# Patient Record
Sex: Female | Born: 1960 | Race: Black or African American | Hispanic: No | Marital: Married | Smoking: Former smoker
Health system: Southern US, Community
[De-identification: ages and names within clinical notes are randomized; demographics above are authoritative.]

## PROBLEM LIST (undated history)

## (undated) DIAGNOSIS — F419 Anxiety disorder, unspecified: Secondary | ICD-10-CM

## (undated) DIAGNOSIS — D709 Neutropenia, unspecified: Secondary | ICD-10-CM

## (undated) DIAGNOSIS — F32A Depression, unspecified: Secondary | ICD-10-CM

## (undated) DIAGNOSIS — D649 Anemia, unspecified: Secondary | ICD-10-CM

## (undated) DIAGNOSIS — A90 Dengue fever [classical dengue]: Secondary | ICD-10-CM

## (undated) DIAGNOSIS — C801 Malignant (primary) neoplasm, unspecified: Secondary | ICD-10-CM

## (undated) DIAGNOSIS — I10 Essential (primary) hypertension: Secondary | ICD-10-CM

## (undated) DIAGNOSIS — F329 Major depressive disorder, single episode, unspecified: Secondary | ICD-10-CM

## (undated) DIAGNOSIS — E079 Disorder of thyroid, unspecified: Secondary | ICD-10-CM

## (undated) HISTORY — DX: Disorder of thyroid, unspecified: E07.9

## (undated) HISTORY — DX: Essential (primary) hypertension: I10

## (undated) HISTORY — DX: Malignant (primary) neoplasm, unspecified: C80.1

## (undated) HISTORY — DX: Depression, unspecified: F32.A

## (undated) HISTORY — DX: Anemia, unspecified: D64.9

## (undated) HISTORY — DX: Dengue fever (classical dengue): A90

## (undated) HISTORY — DX: Neutropenia, unspecified: D70.9

## (undated) HISTORY — DX: Anxiety disorder, unspecified: F41.9

---
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
## 2006-04-18 ENCOUNTER — Ambulatory Visit (HOSPITAL_COMMUNITY): Admission: RE | Admit: 2006-04-18 | Discharge: 2006-04-18 | Payer: Self-pay | Admitting: Family Medicine

## 2006-04-26 ENCOUNTER — Encounter: Admission: RE | Admit: 2006-04-26 | Discharge: 2006-04-26 | Payer: Self-pay | Admitting: Family Medicine

## 2006-08-21 ENCOUNTER — Other Ambulatory Visit: Admission: RE | Admit: 2006-08-21 | Discharge: 2006-08-21 | Payer: Self-pay | Admitting: Family Medicine

## 2006-09-24 ENCOUNTER — Encounter: Admission: RE | Admit: 2006-09-24 | Discharge: 2006-09-24 | Payer: Self-pay | Admitting: Family Medicine

## 2007-07-17 ENCOUNTER — Encounter: Admission: RE | Admit: 2007-07-17 | Discharge: 2007-07-17 | Payer: Self-pay | Admitting: Family Medicine

## 2008-01-27 ENCOUNTER — Other Ambulatory Visit: Admission: RE | Admit: 2008-01-27 | Discharge: 2008-01-27 | Payer: Self-pay | Admitting: Family Medicine

## 2008-08-04 ENCOUNTER — Ambulatory Visit (HOSPITAL_COMMUNITY): Admission: RE | Admit: 2008-08-04 | Discharge: 2008-08-04 | Payer: Self-pay | Admitting: Family Medicine

## 2008-08-07 ENCOUNTER — Encounter: Admission: RE | Admit: 2008-08-07 | Discharge: 2008-08-07 | Payer: Self-pay | Admitting: Family Medicine

## 2009-04-04 IMAGING — MG MM DIAGNOSTIC UNILATERAL L
2 series · 2 of 2 positions shown · non-contrast
Comparison: Previous examinations.

DG DIAGNOSTIC UNILATERAL L
CC and MLO view(s) were taken of the left breast.

LEFT BREAST ULTRASOUND
DIGITAL UNILATERAL LEFT DIAGNOSTIC MAMMOGRAM WITH CAD AND LEFT BREAST ULTRASOUND:
CLINICAL DATA: Pain and tenderness in the lateral aspects of the left breast.

[L CC]
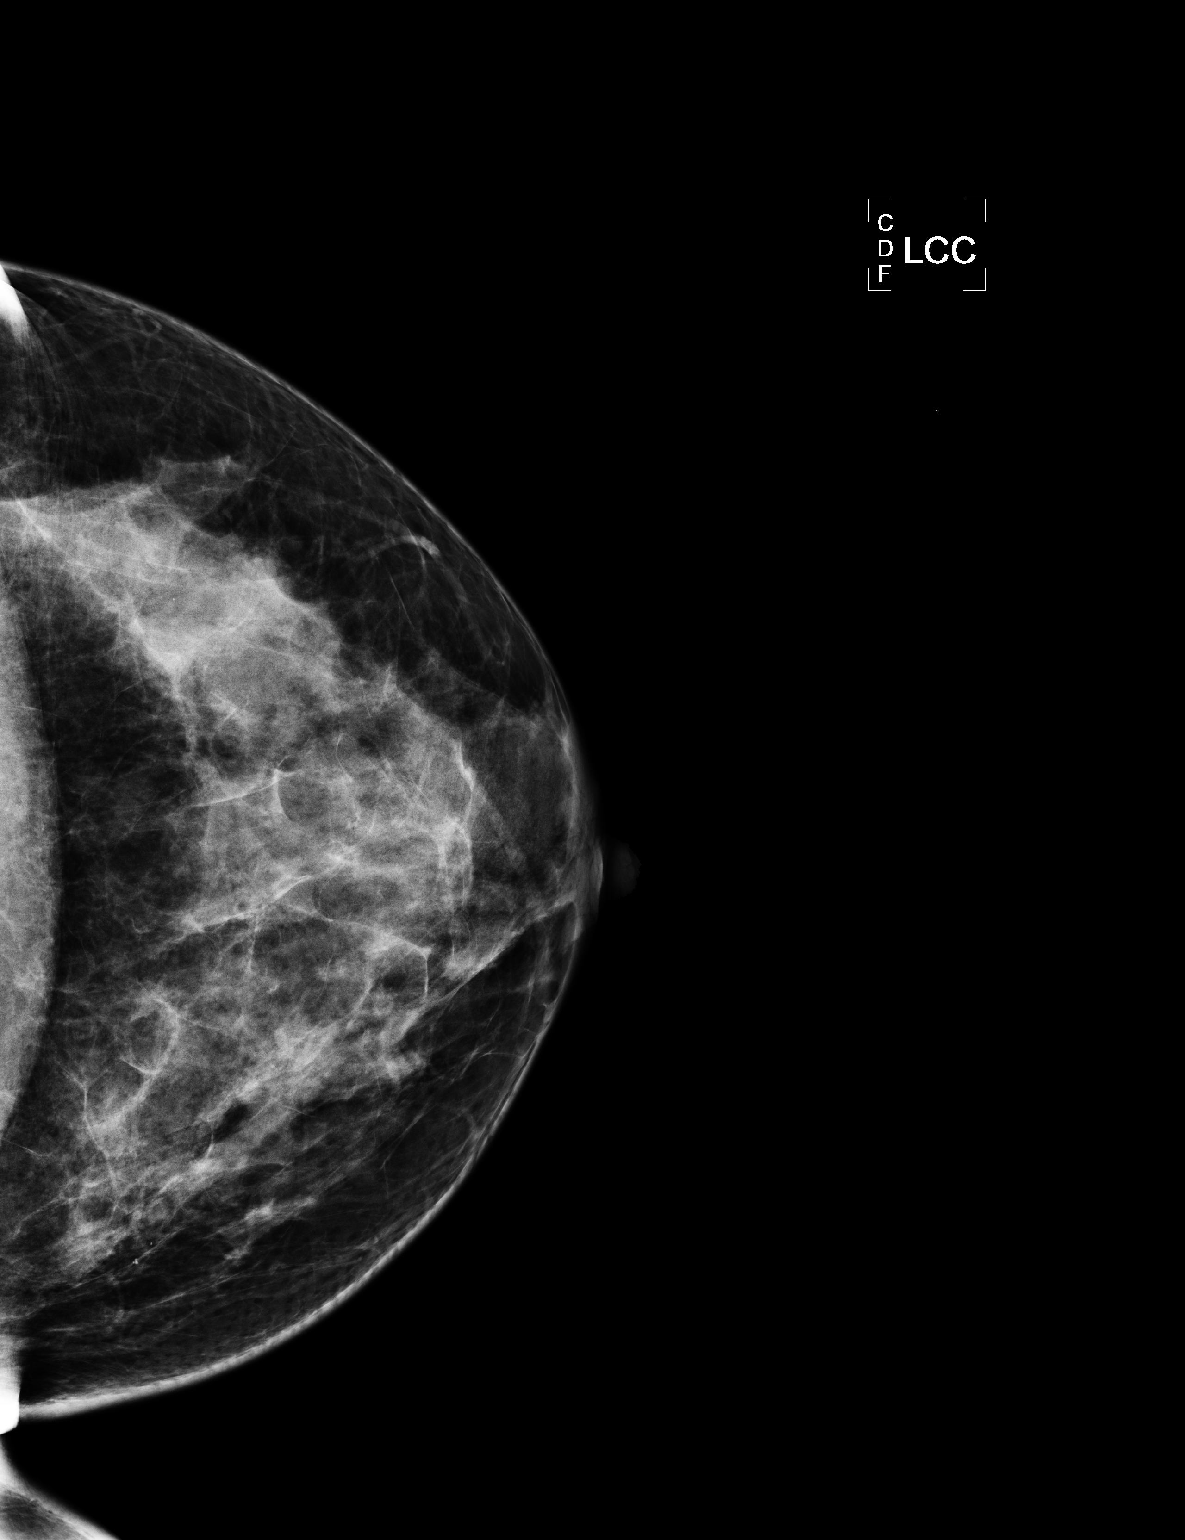

[L MLO]
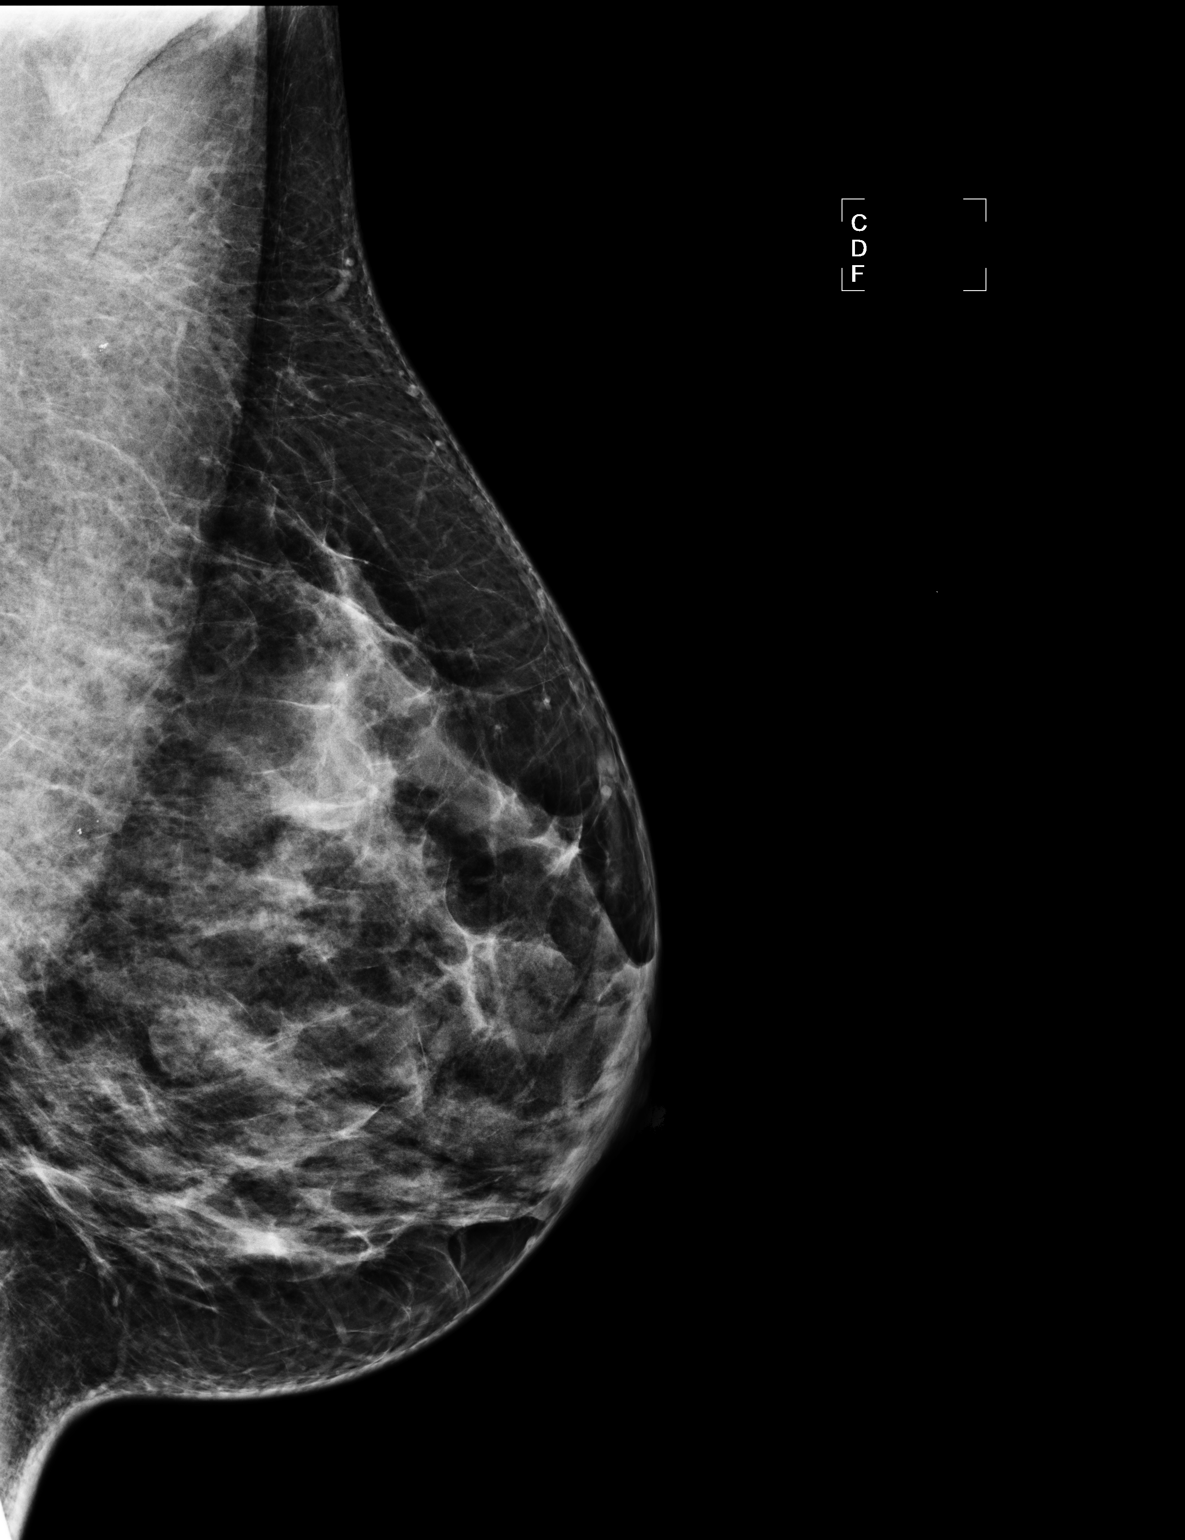

[2 of 2 positions shown; findings below may reference images not displayed]

Heterogeneously dense parenchyma in the left breast is stable with no findings suspicious for 
malignancy.

Physical examination of the lateral aspects of the left breast demonstrate no palpable masses.  
Sonographic examination of those portions of the breast again demonstrated a cluster of microcysts.
This is currently in the [DATE] position, approximately 5 cm from the nipple.   On 04/26/06, this 
measured 1.2 x 1.2 x 0.5 cm in maximum dimensions.  Currently, this measures 1.2 x 0.8 x 0.6 cm in 
maximum corresponding dimensions.  There are additional small, simple cysts scattered in the 
lateral aspects of the left breast, as well as some fibrocystic changes.  No solid masses or other 
findings suspicious for malignancy were seen.
IMPRESSION: Stable left breast cluster of microcysts.  There are additional simple cysts in the left breast 
with no evidence of malignancy.  Annual screening mammography is recommended.  The patient will be 
due for her next screening mammogram on 04/19/07.

ASSESSMENT: Benign - BI-RADS 2

Screening mammogram of both breasts in 7 months.
ANALYZED BY COMPUTER AIDED DETECTION. ,

## 2009-10-28 ENCOUNTER — Ambulatory Visit (HOSPITAL_COMMUNITY): Admission: RE | Admit: 2009-10-28 | Discharge: 2009-10-28 | Payer: Self-pay | Admitting: Obstetrics and Gynecology

## 2010-07-03 LAB — CBC
MCH: 29.1 pg (ref 26.0–34.0)
MCHC: 33.9 g/dL (ref 30.0–36.0)
MCV: 85.9 fL (ref 78.0–100.0)
Platelets: 220 10*3/uL (ref 150–400)
RDW: 16.2 % — ABNORMAL HIGH (ref 11.5–15.5)

## 2010-07-03 LAB — HCG, SERUM, QUALITATIVE: Preg, Serum: NEGATIVE

## 2010-08-05 ENCOUNTER — Other Ambulatory Visit: Payer: Self-pay | Admitting: Obstetrics and Gynecology

## 2011-02-16 IMAGING — MG MM DIAGNOSTIC LTD LEFT
2 series · 2 of 2 positions shown · non-contrast
Comparison: 08/04/2008

CLINICAL DATA: The patient returns after screening study for
evaluation of the left breast.

DIGITAL DIAGNOSTIC  LEFT  MAMMOGRAM  WITH CAD AND LEFT BREAST
ULTRASOUND:

[L ML]
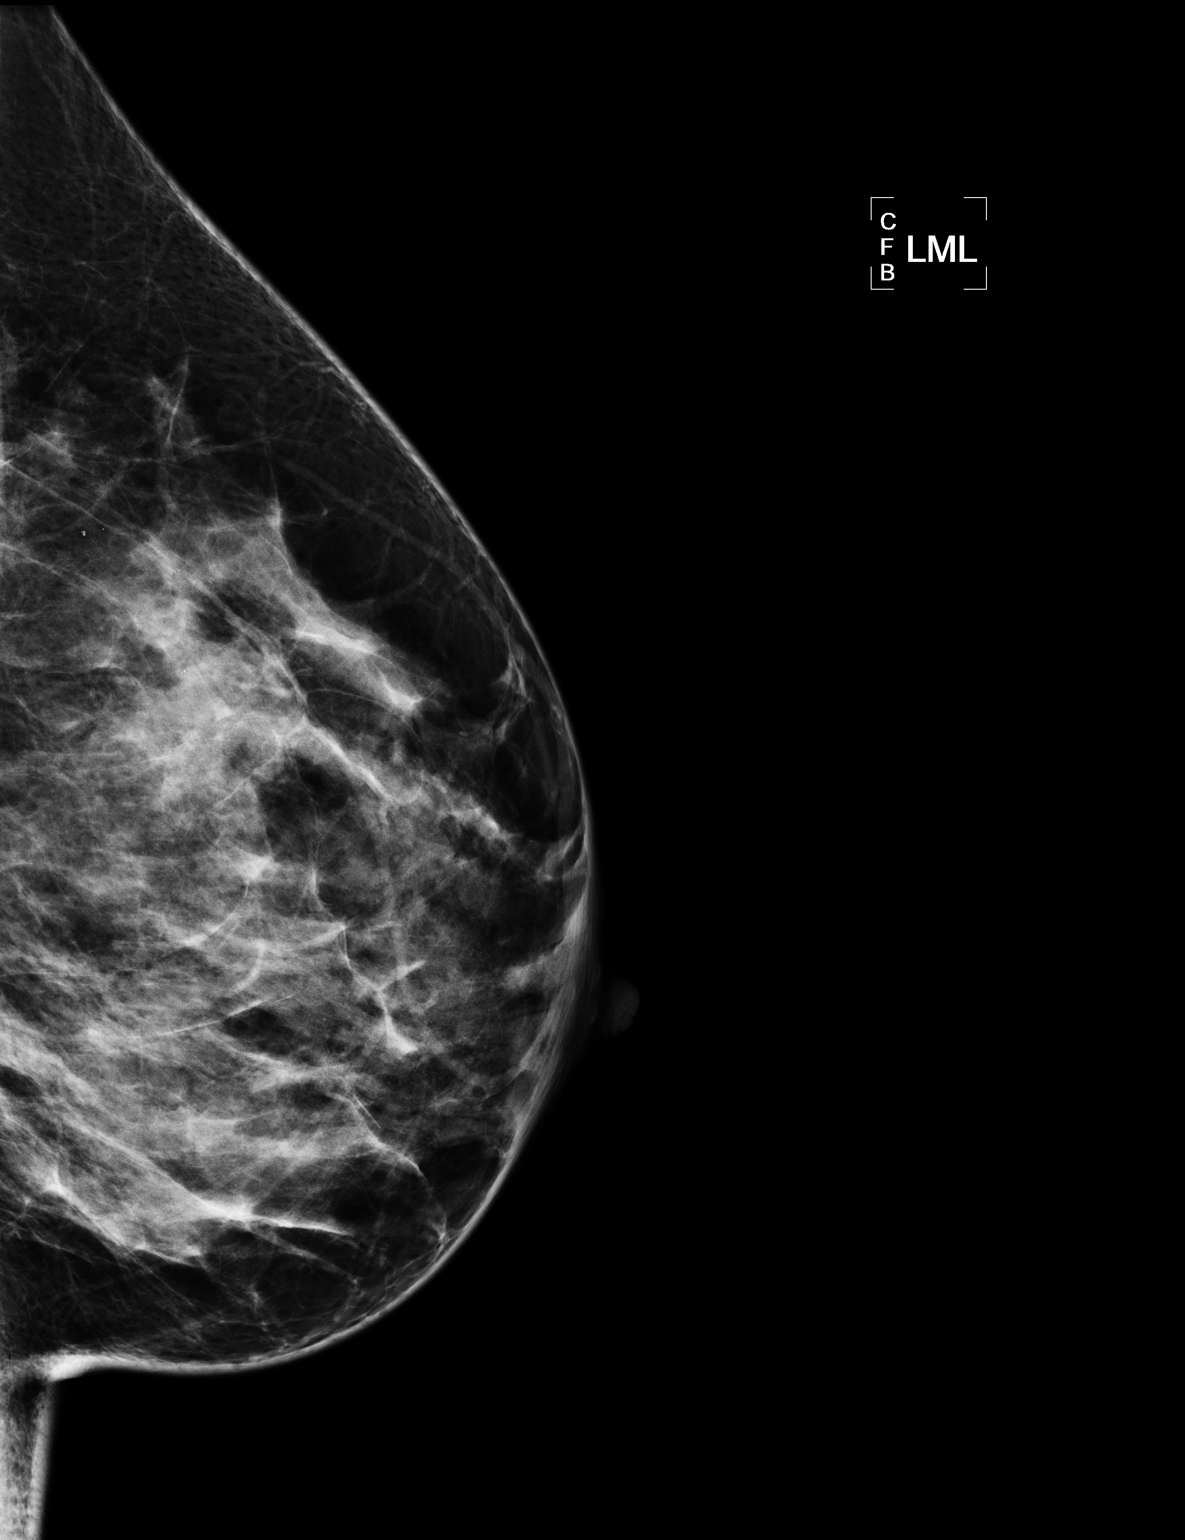

[L CC]
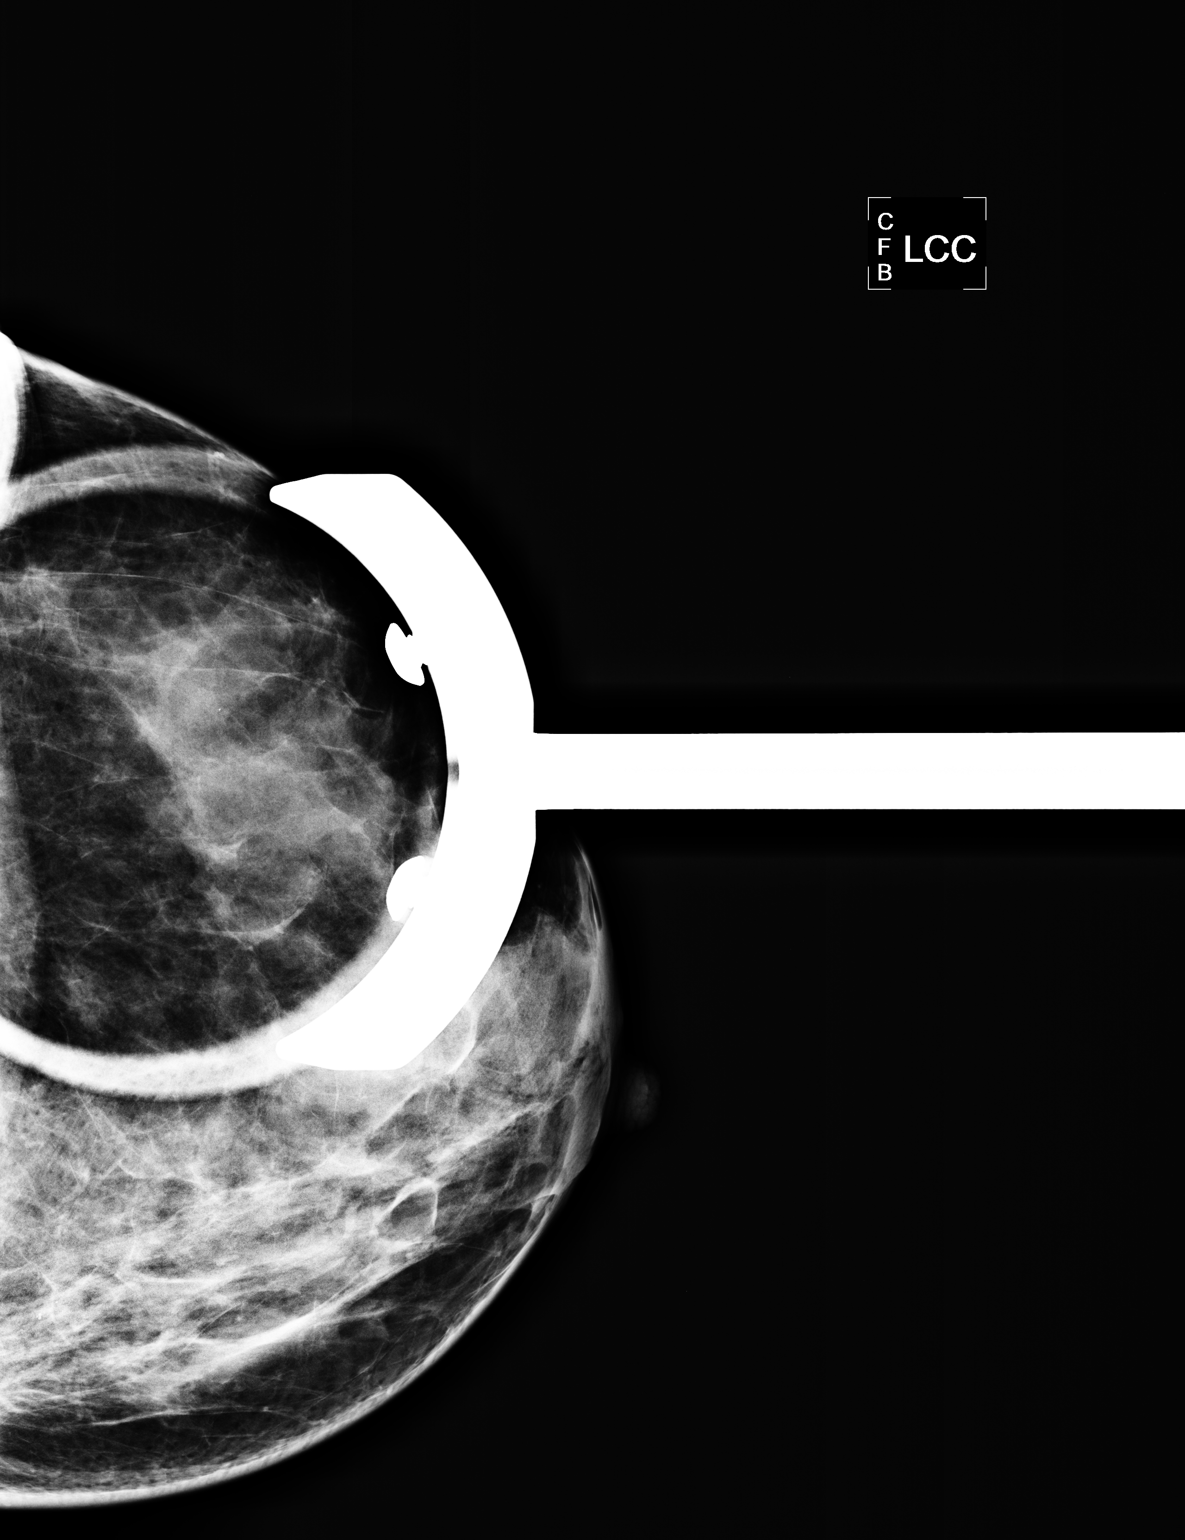

[2 of 2 positions shown; findings below may reference images not displayed]

FINDINGS: Breast parenchyma is heterogeneously dense.  No
persistent abnormality is identified on additional views of the
left breast.

On physical exam, I do not palpate an abnormality in the lateral
quadrants of the left breast.

Ultrasound is performed, showing normal-appearing dense
fibroglandular tissue in the lateral quadrants. No mass,
distortion, or acoustic shadowing is demonstrated with ulstrasound.
IMPRESSION: No mammographic or sonographic evidence for malignancy.  Screening
mammogram is suggested in 1 year.

BI-RADS CATEGORY 1:  Negative.

## 2011-11-14 ENCOUNTER — Telehealth: Payer: Self-pay | Admitting: Oncology

## 2011-11-14 NOTE — Telephone Encounter (Signed)
home # has a full mailbox and her work number [phone] cannot be reached.  left a vm at the main a& t number for her to call my direct #

## 2011-11-15 ENCOUNTER — Telehealth: Payer: Self-pay | Admitting: Oncology

## 2011-11-15 NOTE — Telephone Encounter (Signed)
called pt to make new pt appt and pt would like to gain more info from dr griffin before she makes a new pt appt   aom

## 2011-11-17 ENCOUNTER — Telehealth: Payer: Self-pay | Admitting: Oncology

## 2011-11-17 NOTE — Telephone Encounter (Signed)
appts made and pt awaer of 8/8 appt

## 2011-11-17 NOTE — Telephone Encounter (Signed)
CALLED PT BACK AS SHE DID NOT RET MY CALL,ASKED HER IF SHE WOULD LIKE TO MAKE THE APPT AND AGAIN SHE ASKED WHAT THIS WAS ABOUT REMINDED HER THAT SHE STATED SHE WOULD CALL HER PCP BACK AND SHE STATED THAT SHE FORGOT AND WOULD DO THAT NOW,ADVISED HER TO CALL BACK ASAP      ANNE

## 2011-11-22 ENCOUNTER — Other Ambulatory Visit: Payer: Self-pay | Admitting: Oncology

## 2011-11-22 ENCOUNTER — Telehealth: Payer: Self-pay | Admitting: Oncology

## 2011-11-22 ENCOUNTER — Encounter: Payer: Self-pay | Admitting: Oncology

## 2011-11-22 DIAGNOSIS — D709 Neutropenia, unspecified: Secondary | ICD-10-CM | POA: Insufficient documentation

## 2011-11-22 NOTE — Telephone Encounter (Signed)
Referred by Dr. Maurice Small Dx- Leukopenia/Thrombocytopenia.

## 2011-11-23 ENCOUNTER — Ambulatory Visit: Payer: BC Managed Care – PPO | Admitting: Oncology

## 2011-11-23 ENCOUNTER — Other Ambulatory Visit (HOSPITAL_BASED_OUTPATIENT_CLINIC_OR_DEPARTMENT_OTHER): Payer: BC Managed Care – PPO

## 2011-11-23 ENCOUNTER — Encounter: Payer: Self-pay | Admitting: *Deleted

## 2011-11-23 ENCOUNTER — Ambulatory Visit: Payer: BC Managed Care – PPO

## 2011-11-23 VITALS — BP 135/85 | HR 83 | Temp 97.2°F | Resp 20 | Ht 60.0 in | Wt 139.2 lb

## 2011-11-23 DIAGNOSIS — D709 Neutropenia, unspecified: Secondary | ICD-10-CM

## 2011-11-23 LAB — CBC WITH DIFFERENTIAL/PLATELET
Basophils Absolute: 0.1 10*3/uL (ref 0.0–0.1)
Eosinophils Absolute: 0.1 10*3/uL (ref 0.0–0.5)
HCT: 40.2 % (ref 34.8–46.6)
HGB: 13.8 g/dL (ref 11.6–15.9)
MCV: 87.7 fL (ref 79.5–101.0)
RDW: 14.4 % (ref 11.2–14.5)
WBC: 6.7 10*3/uL (ref 3.9–10.3)

## 2011-11-23 NOTE — Progress Notes (Signed)
Pt questioning reason for referral to Dr. Gaylyn Rong upon arrival to office.   States she was not told by her PCP and wants to make sure it is necessary before she owes $34 co pay for visit.  Explained that the referral we received from Dr. Valentina Lucks was to evaluate for low WBC and low Platelets.  Pt says she went to Urgent Care last month for presumed virus and they did her labs there,  Assumes the labs were sent to Dr. Valentina Lucks and she then made this referral.  Pt says she has not seen Dr. Valentina Lucks in several years and would have preferred to have her labs repeated by PCP before coming to a Specialist.    CBC today is wnl.  Reviewed lab and explained above to Dr. Gaylyn Rong.   He said pt can cancel new pt visit today and f/u w/ her PCP instead.   He understands if pt does not feel necessary to see Hematologist today.   He says pt can be referred back at any time if needed,  If indicated.   Pt states she would like to go ahead and cancel visit today and she left w/o seeing Dr. Gaylyn Rong.   Gave pt a copy of her CBC and instructed to f/u w/ PCP.  She verbalized understanding.

## 2011-11-23 NOTE — Progress Notes (Signed)
Patient presented to clinic asking registration why she was referred here.  Apparently her PCP who referred her did not tell her the reason was neutropenia.    She had CBC checked today and her ANC was normal.  Apparently she told Ms. Baird Cancer, nursing staff that she had low ANC when she was getting over a viral infection.  She did not want to be seen by me today.  I advised my nursing staff to advise her to follow up with her PCP in the future to ensure that her CBC remains normal.

## 2019-04-30 ENCOUNTER — Encounter: Payer: Self-pay | Admitting: Family Medicine

## 2019-04-30 ENCOUNTER — Ambulatory Visit (INDEPENDENT_AMBULATORY_CARE_PROVIDER_SITE_OTHER): Payer: BC Managed Care – PPO | Admitting: Family Medicine

## 2019-04-30 VITALS — BP 106/84 | HR 67 | Temp 97.2°F | Ht 61.0 in | Wt 178.0 lb

## 2019-04-30 DIAGNOSIS — F4321 Adjustment disorder with depressed mood: Secondary | ICD-10-CM

## 2019-04-30 DIAGNOSIS — Z1211 Encounter for screening for malignant neoplasm of colon: Secondary | ICD-10-CM | POA: Diagnosis not present

## 2019-04-30 DIAGNOSIS — Z0000 Encounter for general adult medical examination without abnormal findings: Secondary | ICD-10-CM

## 2019-04-30 DIAGNOSIS — Z23 Encounter for immunization: Secondary | ICD-10-CM

## 2019-04-30 DIAGNOSIS — Z1159 Encounter for screening for other viral diseases: Secondary | ICD-10-CM | POA: Diagnosis not present

## 2019-04-30 DIAGNOSIS — Z114 Encounter for screening for human immunodeficiency virus [HIV]: Secondary | ICD-10-CM | POA: Diagnosis not present

## 2019-04-30 DIAGNOSIS — Z803 Family history of malignant neoplasm of breast: Secondary | ICD-10-CM

## 2019-04-30 LAB — CBC WITH DIFFERENTIAL/PLATELET
Basophils Absolute: 0 10*3/uL (ref 0.0–0.1)
Basophils Relative: 0.5 % (ref 0.0–3.0)
Eosinophils Absolute: 0.1 10*3/uL (ref 0.0–0.7)
Eosinophils Relative: 1.1 % (ref 0.0–5.0)
HCT: 42.2 % (ref 36.0–46.0)
Hemoglobin: 14.3 g/dL (ref 12.0–15.0)
Lymphocytes Relative: 29.8 % (ref 12.0–46.0)
Lymphs Abs: 2 10*3/uL (ref 0.7–4.0)
MCHC: 33.9 g/dL (ref 30.0–36.0)
MCV: 87.5 fl (ref 78.0–100.0)
Monocytes Absolute: 0.4 10*3/uL (ref 0.1–1.0)
Monocytes Relative: 6.6 % (ref 3.0–12.0)
Neutro Abs: 4.2 10*3/uL (ref 1.4–7.7)
Neutrophils Relative %: 62 % (ref 43.0–77.0)
Platelets: 206 10*3/uL (ref 150.0–400.0)
RBC: 4.82 Mil/uL (ref 3.87–5.11)
RDW: 15.4 % (ref 11.5–15.5)
WBC: 6.8 10*3/uL (ref 4.0–10.5)

## 2019-04-30 LAB — LIPID PANEL
Cholesterol: 266 mg/dL — ABNORMAL HIGH (ref 0–200)
HDL: 87.4 mg/dL (ref >39.00)
LDL Cholesterol: 164 mg/dL — ABNORMAL HIGH (ref 0–99)
NonHDL: 178.86
Total CHOL/HDL Ratio: 3
Triglycerides: 72 mg/dL (ref 0.0–149.0)
VLDL: 14.4 mg/dL (ref 0.0–40.0)

## 2019-04-30 LAB — COMPREHENSIVE METABOLIC PANEL
ALT: 15 U/L (ref 0–35)
AST: 20 U/L (ref 0–37)
Albumin: 4.5 g/dL (ref 3.5–5.2)
Alkaline Phosphatase: 69 U/L (ref 39–117)
BUN: 16 mg/dL (ref 6–23)
CO2: 28 mEq/L (ref 19–32)
Calcium: 9.3 mg/dL (ref 8.4–10.5)
Chloride: 103 mEq/L (ref 96–112)
Creatinine, Ser: 1.02 mg/dL (ref 0.40–1.20)
GFR: 67.53 mL/min (ref >60.00)
Glucose, Bld: 96 mg/dL (ref 70–99)
Potassium: 4.1 mEq/L (ref 3.5–5.1)
Sodium: 139 mEq/L (ref 135–145)
Total Bilirubin: 1.1 mg/dL (ref 0.2–1.2)
Total Protein: 7.5 g/dL (ref 6.0–8.3)

## 2019-04-30 LAB — TSH: TSH: 0.4 u[IU]/mL (ref 0.35–4.50)

## 2019-04-30 LAB — VITAMIN D 25 HYDROXY (VIT D DEFICIENCY, FRACTURES): VITD: 15.95 ng/mL — ABNORMAL LOW (ref 30.00–100.00)

## 2019-04-30 MED ORDER — BUPROPION HCL ER (XL) 150 MG PO TB24: 150.0000 mg | ORAL_TABLET | Freq: Every day | ORAL | 0 refills | Status: DC

## 2019-04-30 NOTE — Patient Instructions (Addendum)
im going to start you on wellbutrin for depression. It has a weight loss component. You just take this daily. I want to see you back in a month. Also giving you a card with our social worker number to see about counseling.   Colorectal Cancer Screening  Colorectal cancer screening is a group of tests that are used to check for colorectal cancer before symptoms develop. Colorectal refers to the colon and rectum. The colon and rectum are located at the end of the digestive tract and carry bowel movements out of the body. Who should have screening? All adults starting at age 43 until age 31 should have screening. Your health care provider may recommend screening at age 31. You will have tests every 1-10 years, depending on your results and the type of screening test. You may have screening tests starting at an earlier age, or more frequently than other people, if you have any of the following risk factors:  A personal or family history of colorectal cancer or abnormal growths (polyps).  Inflammatory bowel disease, such as ulcerative colitis or Crohn's disease.  A history of having radiation treatment to the abdomen or pelvic area for cancer.  Colorectal cancer symptoms, such as changes in bowel habits or blood in your stool.  A type of colon cancer syndrome that is passed from parent to child (hereditary), such as: ? Lynch syndrome. ? Familial adenomatous polyposis. ? Turcot syndrome. ? Peutz-Jeghers syndrome. Screening recommendations for adults who are 32-62 years old vary depending on health. How is screening done? There are several types of colorectal screening tests. You may have one or more of the following:  Guaiac-based fecal occult blood testing. For this test, a stool (feces) sample is checked for hidden (occult) blood, which could be a sign of colorectal cancer.  Fecal immunochemical test (FIT). For this test, a stool sample is checked for blood, which could be a sign of  colorectal cancer.  Stool DNA test. For this test, a stool sample is checked for blood and changes in DNA that could lead to colorectal cancer.  Sigmoidoscopy. During this test, a thin, flexible tube with a camera on the end (sigmoidoscope) is used to examine the rectum and the lower colon.  Colonoscopy. During this test, a long, flexible tube with a camera on the end (colonoscope) is used to examine the entire colon and rectum. With a colonoscopy, it is possible to take a sample of tissue (biopsy) and remove small polyps during the test.  Virtual colonoscopy. Instead of a colonoscope, this type of colonoscopy uses X-rays (CT scan) and computers to produce images of the colon and rectum. What are the benefits of screening? Screening reduces your risk for colorectal cancer and can help identify cancer at an early stage, when the cancer can be removed or treated more easily. It is common for polyps to form in the lining of the colon, especially as you age. These polyps may be cancerous or become cancerous over time. Screening can identify these polyps. What are the risks of screening? Each screening test may have different risks.  Stool sample tests have fewer risks than other types of screening tests. However, you may need more tests to confirm results from a stool sample test.  Screening tests that involve X-rays expose you to low levels of radiation, which may slightly increase your cancer risk. The benefit of detecting cancer outweighs the slight increase in risk.  Screening tests such as sigmoidoscopy and colonoscopy may place you at  risk for bleeding, intestinal damage, infection, or a reaction to medicines given during the exam. Talk with your health care provider to understand your risk for colorectal cancer and to make a screening plan that is right for you. Questions to ask your health care provider  When should I start colorectal cancer screening?  What is my risk for colorectal  cancer?  How often do I need screening?  Which screening tests do I need?  How do I get my test results?  What do my results mean? Where to find more information Learn more about colorectal cancer screening from:  The [place]: www.cancer.org  The [employer]: www.cancer.gov Summary  Colorectal cancer screening is a group of tests used to check for colorectal cancer before symptoms develop.  Screening reduces your risk for colorectal cancer and can help identify cancer at an early stage, when the cancer can be removed or treated more easily.  All adults starting at age 68 until age 38 should have screening. Your health care provider may recommend screening at age 92.  You may have screening tests starting at an earlier age, or more frequently than other people, if you have certain risk factors.  Talk with your health care provider to understand your risk for colorectal cancer and to make a screening plan that is right for you. This information is not intended to replace advice given to you by your health care provider. Make sure you discuss any questions you have with your health care provider. Document Revised: 07/24/2018 Document Reviewed: 01/03/2017 Elsevier Patient Education  2020 Elsevier Inc.    Preventive Care 12-41 Years Old, Female Preventive care refers to visits with your health care provider and lifestyle choices that can promote health and wellness. This includes:  A yearly physical exam. This may also be called an annual well check.  Regular dental visits and eye exams.  Immunizations.  Screening for certain conditions.  Healthy lifestyle choices, such as eating a healthy diet, getting regular exercise, not using drugs or products that contain nicotine and tobacco, and limiting alcohol use. What can I expect for my preventive care visit? Physical exam Your health care provider will check your:  Height and weight. This may be  used to calculate body mass index (BMI), which tells if you are at a healthy weight.  Heart rate and blood pressure.  Skin for abnormal spots. Counseling Your health care provider may ask you questions about your:  Alcohol, tobacco, and drug use.  Emotional well-being.  Home and relationship well-being.  Sexual activity.  Eating habits.  Work and work Statistician.  Method of birth control.  Menstrual cycle.  Pregnancy history. What immunizations do I need?  Influenza (flu) vaccine  This is recommended every year. Tetanus, diphtheria, and pertussis (Tdap) vaccine  You may need a Td booster every 10 years. Varicella (chickenpox) vaccine  You may need this if you have not been vaccinated. Zoster (shingles) vaccine  You may need this after age 31. Measles, mumps, and rubella (MMR) vaccine  You may need at least one dose of MMR if you were born in 1957 or later. You may also need a second dose. Pneumococcal conjugate (PCV13) vaccine  You may need this if you have certain conditions and were not previously vaccinated. Pneumococcal polysaccharide (PPSV23) vaccine  You may need one or two doses if you smoke cigarettes or if you have certain conditions. Meningococcal conjugate (MenACWY) vaccine  You may need this if you have certain  conditions. Hepatitis A vaccine  You may need this if you have certain conditions or if you travel or work in places where you may be exposed to hepatitis A. Hepatitis B vaccine  You may need this if you have certain conditions or if you travel or work in places where you may be exposed to hepatitis B. Haemophilus influenzae type b (Hib) vaccine  You may need this if you have certain conditions. Human papillomavirus (HPV) vaccine  If recommended by your health care provider, you may need three doses over 6 months. You may receive vaccines as individual doses or as more than one vaccine together in one shot (combination vaccines). Talk  with your health care provider about the risks and benefits of combination vaccines. What tests do I need? Blood tests  Lipid and cholesterol levels. These may be checked every 5 years, or more frequently if you are over 77 years old.  Hepatitis C test.  Hepatitis B test. Screening  Lung cancer screening. You may have this screening every year starting at age 28 if you have a 30-pack-year history of smoking and currently smoke or have quit within the past 15 years.  Colorectal cancer screening. All adults should have this screening starting at age 79 and continuing until age 65. Your health care provider may recommend screening at age 52 if you are at increased risk. You will have tests every 1-10 years, depending on your results and the type of screening test.  Diabetes screening. This is done by checking your blood sugar (glucose) after you have not eaten for a while (fasting). You may have this done every 1-3 years.  Mammogram. This may be done every 1-2 years. Talk with your health care provider about when you should start having regular mammograms. This may depend on whether you have a family history of breast cancer.  BRCA-related cancer screening. This may be done if you have a family history of breast, ovarian, tubal, or peritoneal cancers.  Pelvic exam and Pap test. This may be done every 3 years starting at age 58. Starting at age 33, this may be done every 5 years if you have a Pap test in combination with an HPV test. Other tests  Sexually transmitted disease (STD) testing.  Bone density scan. This is done to screen for osteoporosis. You may have this scan if you are at high risk for osteoporosis. Follow these instructions at home: Eating and drinking  Eat a diet that includes fresh fruits and vegetables, whole grains, lean protein, and low-fat dairy.  Take vitamin and mineral supplements as recommended by your health care provider.  Do not drink alcohol if: ? Your  health care provider tells you not to drink. ? You are pregnant, may be pregnant, or are planning to become pregnant.  If you drink alcohol: ? Limit how much you have to 0-1 drink a day. ? Be aware of how much alcohol is in your drink. In the [place] one drink equals one 12 oz bottle of beer (355 mL), one 5 oz glass of wine (148 mL), or one 1 oz glass of hard liquor (44 mL). Lifestyle  Take daily care of your teeth and gums.  Stay active. Exercise for at least 30 minutes on 5 or more days each week.  Do not use any products that contain nicotine or tobacco, such as cigarettes, e-cigarettes, and chewing tobacco. If you need help quitting, ask your health care provider.  If you are sexually active, practice safe  sex. Use a condom or other form of birth control (contraception) in order to prevent pregnancy and STIs (sexually transmitted infections).  If told by your health care provider, take low-dose aspirin daily starting at age 67. What's next?  Visit your health care provider once a year for a well check visit.  Ask your health care provider how often you should have your eyes and teeth checked.  Stay up to date on all vaccines. This information is not intended to replace advice given to you by your health care provider. Make sure you discuss any questions you have with your health care provider. Document Revised: 12/13/2017 Document Reviewed: 12/13/2017 Elsevier Patient Education  2020 [employer].

## 2019-04-30 NOTE — Progress Notes (Signed)
Patient: Diane Jenkins MRN: [id number] DOB: 06/02/1960 PCP: Orma Flaming, MD     Subjective:  Chief Complaint  Patient presents with  . Establish Care  . Annual Exam    HPI: The patient is a 59 y.o. female who presents today for annual exam. She denies any changes to past medical history. There have been no recent hospitalizations. They are not following a well balanced diet and exercise plan. Weight has been stable. No complaints today. Post menopausal: age 40 years. She thinks she may be depressed.   Maternal grandmother died from ovarian cancer in 66. Mother had breast cancer.   She states she may be depressed. 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 She is having a hard time moving forward and forgiving him. They seem to be committed to making the marriage work. She finds herself tearful all of the time, guilty and doesn't trust him. Anything can set her off. She did 4 sessions of counseling and that is it.   There is no immunization history for the selected administration types on file for this patient.  Colonoscopy: doesn't think she has had this.  Mammogram: has not done in a long long time.  Pap smear: long time.    Review of Systems  Constitutional: Positive for fatigue. Negative for chills and fever.  HENT: Negative for dental problem, ear pain, hearing loss and trouble swallowing.   Eyes: Negative for visual disturbance.  Respiratory: Negative for cough, chest tightness and shortness of breath.   Cardiovascular: Negative for chest pain, palpitations and leg swelling.  Gastrointestinal: Negative for abdominal pain, blood in stool, diarrhea, nausea and vomiting.  Endocrine: Negative for cold intolerance, polydipsia, polyphagia and polyuria.  Genitourinary: Negative for dysuria and hematuria.  Musculoskeletal: Negative for arthralgias.  Skin:  Negative for rash.  Neurological: Negative for dizziness and headaches.  Psychiatric/Behavioral: Positive for dysphoric mood. Negative for sleep disturbance. The patient is not nervous/anxious.     Allergies Patient has No Known Allergies.  Past Medical History Patient  has a past medical history of Neutropenia ([place]).  Surgical History Patient  has no past surgical history on file.  Family History Pateint's family history is not on file.  Social History Patient  reports that she has been smoking cigars. She has never used smokeless tobacco. She reports current alcohol use. She reports that she does not use drugs.    Objective: Vitals:   04/30/19 0948  BP: 106/84  Pulse: 67  Temp: (!) 97.2 F (36.2 C)  TempSrc: Temporal  SpO2: 95%  Weight: 178 lb (80.7 kg)  Height: '5\' 1"'$  (1.549 m)    Body mass index is 33.63 kg/m.  Physical Exam Vitals reviewed.  Constitutional:      Appearance: Normal appearance. She is well-developed.  HENT:     Head: Normocephalic and atraumatic.     Right Ear: Tympanic membrane, ear canal and external ear normal.     Left Ear: Tympanic membrane, ear canal and external ear normal.     Nose: Nose normal.     Mouth/Throat:     Mouth: Mucous membranes are moist.  Eyes:     Extraocular Movements: Extraocular movements intact.     Conjunctiva/sclera: Conjunctivae normal.     Pupils: Pupils are equal, round, and reactive to light.  Neck:     Thyroid: No thyromegaly.  Cardiovascular:  Rate and Rhythm: Normal rate and regular rhythm.     Heart sounds: Normal heart sounds. No murmur.  Pulmonary:     Effort: Pulmonary effort is normal.     Breath sounds: Normal breath sounds.  Abdominal:     General: Bowel sounds are normal. There is no distension.     Palpations: Abdomen is soft.     Tenderness: There is no abdominal tenderness.  Musculoskeletal:     Cervical back: Normal range of motion and neck supple.  Lymphadenopathy:     Cervical: No  cervical adenopathy.  Skin:    General: Skin is warm and dry.     Capillary Refill: Capillary refill takes less than 2 seconds.     Findings: No rash.  Neurological:     General: No focal deficit present.     Mental Status: She is alert and oriented to person, place, and time.     Cranial Nerves: No cranial nerve deficit.     Coordination: Coordination normal.     Deep Tendon Reflexes: Reflexes normal.  Psychiatric:        Mood and Affect: Mood normal.        Behavior: Behavior normal.     Comments: Tearful at times           Office Visit from 04/30/2019 in [facility]  PHQ-9 Total Score  9      Assessment/plan: 1. Annual physical exam Discussed maternal hx of breast cancer and maternal grandmother with ovarian cancer. I think she needs BRCA tested, but she declines. Discussed risks and high risk of having genetic mutation that puts her more at risk for these cancers, but she doesn't want it done. Handout given for mmg and really encouraged her to at least get her screenings. Routine lab work today. She will come back for pap smear. Addressed all of her HM today. Encouraged exercise and limited alcohol intake.  - CBC with Differential/Platelet - Comprehensive metabolic panel - Lipid panel - TSH - VITAMIN D 25 Hydroxy (Vit-D Deficiency, Fractures)  2. Encounter for screening for HIV  - HIV antibody  3. Need for Tdap vaccination  - Tdap vaccine greater than or equal to 7yo IM  4. Encounter for hepatitis C screening test for low risk patient  - Hepatitis C antibody  5. Screening for colon cancer  - [facility]  6. Situational depression -she has a lot going on with news/learnign of affair. Very tearful. phq9 score is mild. I think she has component of grieving as well. I want her to get back in therapy and gave her a contact for this. We are going to start her on wellbutrin low dose to help with depression and weight loss. No  contraindication and side effects discussed. Exercise would be beneficial as well. Will have close f/u in one month. Declined STD screening as her husband did get tested.   This visit occurred during the SARS-CoV-2 public health emergency.  Safety protocols were in place, including screening questions prior to the visit, additional usage of staff PPE, and extensive cleaning of exam room while observing appropriate contact time as indicated for disinfecting solutions.     Return in about 1 month (around 05/31/2019) for medication check up .    Orma Flaming, MD [place]  04/30/2019

## 2019-05-01 LAB — HEPATITIS C ANTIBODY
Hepatitis C Ab: NONREACTIVE
SIGNAL TO CUT-OFF: 0.02 (ref <1.00)

## 2019-05-01 LAB — HIV ANTIBODY (ROUTINE TESTING W REFLEX): HIV 1&2 Ab, 4th Generation: NONREACTIVE

## 2019-05-02 ENCOUNTER — Other Ambulatory Visit: Payer: Self-pay | Admitting: Family Medicine

## 2019-05-02 DIAGNOSIS — E559 Vitamin D deficiency, unspecified: Secondary | ICD-10-CM | POA: Insufficient documentation

## 2019-05-02 MED ORDER — VITAMIN D (ERGOCALCIFEROL) 1.25 MG (50000 UNIT) PO CAPS: ORAL_CAPSULE | ORAL | 0 refills | Status: DC

## 2019-06-16 ENCOUNTER — Ambulatory Visit (INDEPENDENT_AMBULATORY_CARE_PROVIDER_SITE_OTHER): Payer: BC Managed Care – PPO | Admitting: Family Medicine

## 2019-06-16 ENCOUNTER — Encounter: Payer: Self-pay | Admitting: Family Medicine

## 2019-06-16 VITALS — BP 122/70 | HR 71 | Temp 97.2°F | Ht 61.0 in | Wt 182.0 lb

## 2019-06-16 DIAGNOSIS — F4321 Adjustment disorder with depressed mood: Secondary | ICD-10-CM | POA: Diagnosis not present

## 2019-06-16 DIAGNOSIS — E782 Mixed hyperlipidemia: Secondary | ICD-10-CM | POA: Insufficient documentation

## 2019-06-16 DIAGNOSIS — E559 Vitamin D deficiency, unspecified: Secondary | ICD-10-CM | POA: Diagnosis not present

## 2019-06-16 NOTE — Progress Notes (Deleted)
Patient: Diane Jenkins MRN: [id number] DOB: July 27, 1960 PCP: Orma Flaming, MD     Subjective:  No chief complaint on file.   HPI: The patient is a 59 y.o. female who presents today for Depression follow up.  Review of Systems  Constitutional: Negative for chills, fatigue and fever.  HENT: Negative for sinus pain, sneezing and sore throat.   Respiratory: Negative for cough, chest tightness and shortness of breath.   Cardiovascular: Negative for chest pain, palpitations and leg swelling.  Gastrointestinal: Negative for nausea and vomiting.  Genitourinary: Negative for frequency, pelvic pain and urgency.    Allergies Patient has No Known Allergies.  Past Medical History Patient  has a past medical history of Neutropenia ([place]).  Surgical History Patient  has no past surgical history on file.  Family History Pateint's family history includes Cancer in her maternal grandmother, mother, and paternal grandmother; Early death in her mother; Heart attack in her father; Heart disease in her father; Hypertension in her father; Stroke in her father.  Social History Patient  reports that she has been smoking cigars. She has never used smokeless tobacco. She reports current alcohol use. She reports that she does not use drugs.    Objective: There were no vitals filed for this visit.  There is no height or weight on file to calculate BMI.  Physical Exam     Assessment/plan:      No follow-ups on file.     @AWME @ 06/16/2019
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
## 2019-06-23 ENCOUNTER — Encounter: Payer: Self-pay | Admitting: Gastroenterology

## 2019-07-10 ENCOUNTER — Ambulatory Visit: Payer: Self-pay | Attending: Family

## 2019-07-10 DIAGNOSIS — Z23 Encounter for immunization: Secondary | ICD-10-CM

## 2019-07-10 NOTE — Progress Notes (Signed)
   Covid-19 Vaccination Clinic  Name:  Diane Jenkins    MRN: [id number] DOB: February 25, 1961  07/10/2019  [name] was observed post Covid-19 immunization for 15 minutes without incident. She was provided with Vaccine Information Sheet and instruction to access the V-Safe system.   [name] was instructed to call 911 with any severe reactions post vaccine: Marland Kitchen Difficulty breathing  . Swelling of face and throat  . A fast heartbeat  . A bad rash all over body  . Dizziness and weakness   Immunizations Administered    Name Date Dose VIS Date Route   Moderna COVID-19 Vaccine 07/10/2019  2:41 PM 0.5 mL 03/18/2019 Intramuscular   Manufacturer: Moderna   Lot: [id number]   [id number]

## 2019-07-15 ENCOUNTER — Ambulatory Visit (AMBULATORY_SURGERY_CENTER): Payer: Self-pay

## 2019-07-15 ENCOUNTER — Other Ambulatory Visit: Payer: Self-pay

## 2019-07-15 VITALS — Temp 96.8°F | Ht 61.0 in | Wt 175.0 lb

## 2019-07-15 DIAGNOSIS — Z1211 Encounter for screening for malignant neoplasm of colon: Secondary | ICD-10-CM

## 2019-07-15 NOTE — Progress Notes (Signed)
No allergies to soy or egg Pt is not on blood thinners or diet pills  No hx of surgeries so issues with sedation/intubation are unknown.  Denies atrial flutter/fib Denies constipation   Emmi instructions given to pt  Pt is aware of Covid safety and care partner requirements.  Pt voiced that she is not sure she wants to do procedure.  Reviewed the cancellation policy with her.  She notes that she will process what's been said and then make a decision.

## 2019-07-20 ENCOUNTER — Other Ambulatory Visit: Payer: Self-pay | Admitting: Family Medicine

## 2019-07-21 ENCOUNTER — Other Ambulatory Visit: Payer: Self-pay | Admitting: Family Medicine

## 2019-07-21 NOTE — Telephone Encounter (Signed)
LAST APPOINTMENT DATE: 06/16/2019  NEXT APPOINTMENT DATE:@Visit  date not found  Rx Wellbutrin 150mg  LAST REFILL:04/30/2019  QTY:90 0Rf

## 2019-07-25 ENCOUNTER — Encounter: Payer: BC Managed Care – PPO | Admitting: Gastroenterology

## 2019-08-12 ENCOUNTER — Ambulatory Visit: Payer: Self-pay | Attending: Family

## 2019-08-12 DIAGNOSIS — Z23 Encounter for immunization: Secondary | ICD-10-CM

## 2019-08-12 NOTE — Progress Notes (Signed)
   Covid-19 Vaccination Clinic  Name:  Diane Jenkins    MRN: [id number] DOB: 01/28/1961  08/12/2019  [name] was observed post Covid-19 immunization for 15 minutes without incident. She was provided with Vaccine Information Sheet and instruction to access the V-Safe system.   [name] was instructed to call 911 with any severe reactions post vaccine: Marland Kitchen Difficulty breathing  . Swelling of face and throat  . A fast heartbeat  . A bad rash all over body  . Dizziness and weakness   Immunizations Administered    Name Date Dose VIS Date Route   Moderna COVID-19 Vaccine 08/12/2019  1:34 PM 0.5 mL 03/2019 Intramuscular   Manufacturer: Moderna   Lot: [id number]   [id number]

## 2019-09-04 ENCOUNTER — Encounter: Payer: BC Managed Care – PPO | Admitting: Gastroenterology

## 2019-09-08 ENCOUNTER — Encounter: Payer: Self-pay | Admitting: Gastroenterology

## 2019-09-16 ENCOUNTER — Ambulatory Visit (AMBULATORY_SURGERY_CENTER): Payer: BC Managed Care – PPO | Admitting: Gastroenterology

## 2019-09-16 ENCOUNTER — Other Ambulatory Visit: Payer: Self-pay

## 2019-09-16 ENCOUNTER — Encounter: Payer: Self-pay | Admitting: Gastroenterology

## 2019-09-16 VITALS — BP 133/85 | HR 57 | Temp 96.8°F | Resp 17 | Ht 61.0 in | Wt 175.0 lb

## 2019-09-16 DIAGNOSIS — Z1211 Encounter for screening for malignant neoplasm of colon: Secondary | ICD-10-CM | POA: Diagnosis not present

## 2019-09-16 MED ORDER — SODIUM CHLORIDE 0.9 % IV SOLN: 500.0000 mL | Freq: Once | INTRAVENOUS | Status: DC

## 2019-09-16 NOTE — Progress Notes (Signed)
Pt's states no medical or surgical changes since previsit or office visit. 

## 2019-09-16 NOTE — Patient Instructions (Signed)
Discharge instructions given. Handout on Diverticulosis. Resume previous medications. YOU HAD AN ENDOSCOPIC PROCEDURE TODAY AT THE  ENDOSCOPY CENTER:   Refer to the procedure report that was given to you for any specific questions about what was found during the examination.  If the procedure report does not answer your questions, please call your gastroenterologist to clarify.  If you requested that your care partner not be given the details of your procedure findings, then the procedure report has been included in a sealed envelope for you to review at your convenience later.  YOU SHOULD EXPECT: Some feelings of bloating in the abdomen. Passage of more gas than usual.  Walking can help get rid of the air that was put into your GI tract during the procedure and reduce the bloating. If you had a lower endoscopy (such as a colonoscopy or flexible sigmoidoscopy) you may notice spotting of blood in your stool or on the toilet paper. If you underwent a bowel prep for your procedure, you may not have a normal bowel movement for a few days.  Please Note:  You might notice some irritation and congestion in your nose or some drainage.  This is from the oxygen used during your procedure.  There is no need for concern and it should clear up in a day or so.  SYMPTOMS TO REPORT IMMEDIATELY:  Following lower endoscopy (colonoscopy or flexible sigmoidoscopy):  Excessive amounts of blood in the stool  Significant tenderness or worsening of abdominal pains  Swelling of the abdomen that is new, acute  Fever of 100F or higher   For urgent or emergent issues, a gastroenterologist can be reached at any hour by calling [phone]. Do not use MyChart messaging for urgent concerns.    DIET:  We do recommend a small meal at first, but then you may proceed to your regular diet.  Drink plenty of fluids but you should avoid alcoholic beverages for 24 hours.  ACTIVITY:  You should plan to take it easy for  the rest of today and you should NOT DRIVE or use heavy machinery until tomorrow (because of the sedation medicines used during the test).    FOLLOW UP: Our staff will call the number listed on your records 48-72 hours following your procedure to check on you and address any questions or concerns that you may have regarding the information given to you following your procedure. If we do not reach you, we will leave a message.  We will attempt to reach you two times.  During this call, we will ask if you have developed any symptoms of COVID 19. If you develop any symptoms (ie: fever, flu-like symptoms, shortness of breath, cough etc.) before then, please call [phone].  If you test positive for Covid 19 in the 2 weeks post procedure, please call and report this information to us.    If any biopsies were taken you will be contacted by phone or by letter within the next 1-3 weeks.  Please call us at [phone] if you have not heard about the biopsies in 3 weeks.    SIGNATURES/CONFIDENTIALITY: You and/or your care partner have signed paperwork which will be entered into your electronic medical record.  These signatures attest to the fact that that the information above on your After Visit Summary has been reviewed and is understood.  Full responsibility of the confidentiality of this discharge information lies with you and/or your care-partner.  

## 2019-09-16 NOTE — Progress Notes (Signed)
A and O x3. Report to RN. Tolerated MAC anesthesia well.

## 2019-09-16 NOTE — Op Note (Signed)
[facility] Patient Name: Diane Jenkins Procedure Date: 09/16/2019 7:59 AM MRN: [id number] Endoscopist: Mallie Mussel L. Loletha Carrow , MD Age: 59 Referring MD:  Date of Birth: [birth year] Gender: Female Account #: [id number] Procedure:                Colonoscopy Indications:              Screening for colorectal malignant neoplasm, This                            is the patient's first colonoscopy Medicines:                Monitored Anesthesia Care Procedure:                Pre-Anesthesia Assessment:                           - Prior to the procedure, a History and Physical                            was performed, and patient medications and                            allergies were reviewed. The patient's tolerance of                            previous anesthesia was also reviewed. The risks                            and benefits of the procedure and the sedation                            options and risks were discussed with the patient.                            All questions were answered, and informed consent                            was obtained. Prior Anticoagulants: The patient has                            taken no previous anticoagulant or antiplatelet                            agents. ASA Grade Assessment: II - A patient with                            mild systemic disease. After reviewing the risks                            and benefits, the patient was deemed in                            satisfactory condition to undergo the procedure.  After obtaining informed consent, the colonoscope                            was passed under direct vision. Throughout the                            procedure, the patient's blood pressure, pulse, and                            oxygen saturations were monitored continuously. The                            Colonoscope was introduced through the anus and                            advanced to the the cecum,  identified by                            appendiceal orifice and ileocecal valve. The                            colonoscopy was somewhat difficult due to a                            redundant colon. Successful completion of the                            procedure was aided by using manual pressure. The                            patient tolerated the procedure well. The quality                            of the bowel preparation was good. The ileocecal                            valve, appendiceal orifice, and rectum were                            photographed. The bowel preparation used was                            Miralax. Scope In: 8:08:51 AM Scope Out: 8:26:20 AM Scope Withdrawal Time: 0 hours 11 minutes 15 seconds  Total Procedure Duration: 0 hours 17 minutes 29 seconds  Findings:                 The perianal and digital rectal examinations were                            normal.                           A few diverticula were found in the right colon.  The exam was otherwise without abnormality on                            direct and retroflexion views. Complications:            No immediate complications. Estimated Blood Loss:     Estimated blood loss: none. Impression:               - Diverticulosis in the right colon.                           - The examination was otherwise normal on direct                            and retroflexion views.                           - No specimens collected. Recommendation:           - Patient has a contact number available for                            emergencies. The signs and symptoms of potential                            delayed complications were discussed with the                            patient. Return to normal activities tomorrow.                            Written discharge instructions were provided to the                            patient.                           - Resume previous diet.                            - Continue present medications.                           - Repeat colonoscopy in 10 years for screening                            purposes. Samy Ryner L. Loletha Carrow, MD 09/16/2019 8:30:19 AM This report has been signed electronically.

## 2019-09-18 ENCOUNTER — Telehealth: Payer: Self-pay

## 2019-09-18 ENCOUNTER — Telehealth: Payer: Self-pay | Admitting: *Deleted

## 2019-09-18 NOTE — Telephone Encounter (Signed)
Left message on follow up call. 

## 2019-09-18 NOTE — Telephone Encounter (Signed)
Second follow up call attempt. Message left to call with any concerns or questions.

## 2019-10-18 ENCOUNTER — Other Ambulatory Visit: Payer: Self-pay | Admitting: Family Medicine

## 2020-03-16 ENCOUNTER — Ambulatory Visit: Payer: BC Managed Care – PPO | Attending: Internal Medicine

## 2020-03-16 DIAGNOSIS — Z23 Encounter for immunization: Secondary | ICD-10-CM

## 2020-03-16 NOTE — Progress Notes (Signed)
   Covid-19 Vaccination Clinic  Name:  Tymika Grilli    MRN: [id number] DOB: 1960/04/23  03/16/2020  Ms. Beggs was observed post Covid-19 immunization for 15 minutes without incident. She was provided with Vaccine Information Sheet and instruction to access the V-Safe system.   Ms. Farone was instructed to call 911 with any severe reactions post vaccine: Marland Kitchen Difficulty breathing  . Swelling of face and throat  . A fast heartbeat  . A bad rash all over body  . Dizziness and weakness   Immunizations Administered    No immunizations on file.

## 2020-10-21 DIAGNOSIS — R7303 Prediabetes: Secondary | ICD-10-CM | POA: Insufficient documentation

## 2020-10-21 DIAGNOSIS — F32A Depression, unspecified: Secondary | ICD-10-CM | POA: Insufficient documentation

## 2022-02-15 ENCOUNTER — Encounter: Payer: Self-pay | Admitting: Nurse Practitioner

## 2022-02-15 ENCOUNTER — Ambulatory Visit: Payer: BC Managed Care – PPO | Admitting: Nurse Practitioner

## 2022-02-15 VITALS — BP 130/76 | HR 75 | Temp 97.1°F | Resp 14 | Ht 61.0 in | Wt 170.5 lb

## 2022-02-15 DIAGNOSIS — Z8639 Personal history of other endocrine, nutritional and metabolic disease: Secondary | ICD-10-CM | POA: Diagnosis not present

## 2022-02-15 DIAGNOSIS — F322 Major depressive disorder, single episode, severe without psychotic features: Secondary | ICD-10-CM

## 2022-02-15 DIAGNOSIS — Z6832 Body mass index (BMI) 32.0-32.9, adult: Secondary | ICD-10-CM | POA: Diagnosis not present

## 2022-02-15 DIAGNOSIS — R7303 Prediabetes: Secondary | ICD-10-CM | POA: Diagnosis not present

## 2022-02-15 DIAGNOSIS — Z760 Encounter for issue of repeat prescription: Secondary | ICD-10-CM

## 2022-02-15 DIAGNOSIS — E559 Vitamin D deficiency, unspecified: Secondary | ICD-10-CM | POA: Diagnosis not present

## 2022-02-15 DIAGNOSIS — I10 Essential (primary) hypertension: Secondary | ICD-10-CM

## 2022-02-15 DIAGNOSIS — E6609 Other obesity due to excess calories: Secondary | ICD-10-CM

## 2022-02-15 LAB — COMPREHENSIVE METABOLIC PANEL
ALT: 20 U/L (ref 0–35)
AST: 19 U/L (ref 0–37)
Albumin: 4.4 g/dL (ref 3.5–5.2)
Alkaline Phosphatase: 69 U/L (ref 39–117)
BUN: 15 mg/dL (ref 6–23)
CO2: 33 mEq/L — ABNORMAL HIGH (ref 19–32)
Calcium: 9.5 mg/dL (ref 8.4–10.5)
Chloride: 103 mEq/L (ref 96–112)
Creatinine, Ser: 0.89 mg/dL (ref 0.40–1.20)
GFR: 70.67 mL/min (ref >60.00)
Glucose, Bld: 83 mg/dL (ref 70–99)
Potassium: 4.1 mEq/L (ref 3.5–5.1)
Sodium: 141 mEq/L (ref 135–145)
Total Bilirubin: 0.7 mg/dL (ref 0.2–1.2)
Total Protein: 7 g/dL (ref 6.0–8.3)

## 2022-02-15 LAB — LIPID PANEL
Cholesterol: 208 mg/dL — ABNORMAL HIGH (ref 0–200)
HDL: 75 mg/dL (ref >39.00)
LDL Cholesterol: 116 mg/dL — ABNORMAL HIGH (ref 0–99)
NonHDL: 133.13
Total CHOL/HDL Ratio: 3
Triglycerides: 87 mg/dL (ref 0.0–149.0)
VLDL: 17.4 mg/dL (ref 0.0–40.0)

## 2022-02-15 LAB — CBC
HCT: 39.9 % (ref 36.0–46.0)
Hemoglobin: 13.7 g/dL (ref 12.0–15.0)
MCHC: 34.3 g/dL (ref 30.0–36.0)
MCV: 84.7 fl (ref 78.0–100.0)
Platelets: 216 10*3/uL (ref 150.0–400.0)
RBC: 4.71 Mil/uL (ref 3.87–5.11)
RDW: 15.5 % (ref 11.5–15.5)
WBC: 6 10*3/uL (ref 4.0–10.5)

## 2022-02-15 LAB — VITAMIN D 25 HYDROXY (VIT D DEFICIENCY, FRACTURES): VITD: 22.79 ng/mL — ABNORMAL LOW (ref 30.00–100.00)

## 2022-02-15 LAB — HEMOGLOBIN A1C: Hgb A1c MFr Bld: 5.9 % (ref 4.6–6.5)

## 2022-02-15 LAB — TSH: TSH: 0.63 u[IU]/mL (ref 0.35–5.50)

## 2022-02-15 MED ORDER — SERTRALINE HCL 25 MG PO TABS: ORAL_TABLET | ORAL | 0 refills | Status: DC

## 2022-02-15 MED ORDER — HYDROCHLOROTHIAZIDE 12.5 MG PO TABS: 12.5000 mg | ORAL_TABLET | Freq: Every day | ORAL | 1 refills | Status: DC

## 2022-02-15 NOTE — Assessment & Plan Note (Signed)
Patient has been part of a medical weight management clinic and has done well per her report.  Currently maintained on phentermine 37.5 mg tablets half daily and has been on Saxenda in the past.  Ambulatory referral made to medical weight management.  Appreciate support and taking over the phentermine if deemed necessary to continue.

## 2022-02-15 NOTE — Assessment & Plan Note (Signed)
Has been on a weight loss journey.  Per her report has done well.  Pending lab results today

## 2022-02-15 NOTE — Patient Instructions (Signed)
Nice to see you today I will be in touch with the labs once I have them  Follow up with me in 6 week sooner if you need me

## 2022-02-15 NOTE — Assessment & Plan Note (Signed)
Historical diagnosis, pending lab result.
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
## 2022-02-15 NOTE — Assessment & Plan Note (Signed)
Patient currently maintained on hydrochlorothiazide 12.5 mg daily.  Patient blood pressure within normal limits today.  She can check her blood pressure at home.  Continue HCTZ 12.5 mg daily.

## 2022-02-15 NOTE — Assessment & Plan Note (Signed)
PHQ-9 and GAD-7 administered in office.  Patient denies HI/SI/AVH.  Currently maintained on bupropion 150 mg daily.  Therapy ineffective.  We will start patient on sertraline 25 mg nightly for 2 weeks then titrate up to 50 mg nightly for 2 weeks.  Patient will follow-up in 6 weeks, sooner if needed.  Ambulatory referral to therapy done today.

## 2022-02-27 ENCOUNTER — Encounter (INDEPENDENT_AMBULATORY_CARE_PROVIDER_SITE_OTHER): Payer: Self-pay

## 2022-03-10 ENCOUNTER — Other Ambulatory Visit: Payer: Self-pay | Admitting: Nurse Practitioner

## 2022-03-10 DIAGNOSIS — F322 Major depressive disorder, single episode, severe without psychotic features: Secondary | ICD-10-CM

## 2022-03-13 ENCOUNTER — Other Ambulatory Visit: Payer: Self-pay | Admitting: Nurse Practitioner

## 2022-03-13 DIAGNOSIS — F322 Major depressive disorder, single episode, severe without psychotic features: Secondary | ICD-10-CM

## 2022-03-13 MED ORDER — SERTRALINE HCL 50 MG PO TABS: 50.0000 mg | ORAL_TABLET | Freq: Every day | ORAL | 0 refills | Status: DC

## 2022-03-22 ENCOUNTER — Encounter (INDEPENDENT_AMBULATORY_CARE_PROVIDER_SITE_OTHER): Payer: Self-pay | Admitting: Family Medicine

## 2022-03-29 ENCOUNTER — Encounter: Payer: Self-pay | Admitting: Nurse Practitioner

## 2022-03-29 ENCOUNTER — Telehealth (INDEPENDENT_AMBULATORY_CARE_PROVIDER_SITE_OTHER): Payer: BC Managed Care – PPO | Admitting: Nurse Practitioner

## 2022-03-29 VITALS — Ht 61.0 in | Wt 172.0 lb

## 2022-03-29 DIAGNOSIS — F322 Major depressive disorder, single episode, severe without psychotic features: Secondary | ICD-10-CM | POA: Diagnosis not present

## 2022-03-29 MED ORDER — SERTRALINE HCL 50 MG PO TABS: 50.0000 mg | ORAL_TABLET | Freq: Every day | ORAL | 1 refills | Status: DC

## 2022-03-29 NOTE — Progress Notes (Signed)
Patient ID: Diane Jenkins, female    DOB: 1960/12/04, 61 y.o.   MRN: [id number]  Virtual visit completed through [employer], a video enabled telemedicine application. Due to national recommendations of social distancing due to COVID-19, a virtual visit is felt to be most appropriate for this patient at this time. Reviewed limitations, risks, security and privacy concerns of performing a virtual visit and the availability of in person appointments. I also reviewed that there may be a patient responsible charge related to this service. The patient agreed to proceed.   Patient location: home Provider location: [place] [facility], office Persons participating in this virtual visit: patient, provider   If any vitals were documented, they were collected by patient at home unless specified below.    Ht '5\' 1"'$  (1.549 m)   Wt 172 lb (78 kg)   BMI 32.50 kg/m    CC: Depression Subjective:   HPI: Diane Jenkins is a 61 y.o. female presenting on 03/29/2022 for Depression    MDD: Patient was originally seen by me on 02/15/2022.  She has a history of depression and some anxiety.  She was maintained on Wellbutrin in the past along with helping her with her weight loss journey.  PHQ-9 and GAD-7 was administered at that point.  Patient started on sertraline 25 mg for 2 weeks then titrate up to sertraline 50 mg for 2 weeks.  Patient is here for follow-up in regards to her depression  Patient states has been tolerating the medication well she states that she is actually sleeping decently and getting a "deep sleep".  No adverse drug events per patient report.  Did administer PHQ-9 and GAD-7 over the telephone see below.  Patient states she is having some days where she is feeling anxious she is can start journaling that way we have a objective number of how frequently this is happening.     03/29/2022    4:25 PM 02/15/2022   10:41 AM 06/16/2019    8:08 AM  PHQ9 SCORE ONLY  PHQ-9 Total Score '7 18 6         '$ 03/29/2022    4:27 PM 02/15/2022   10:42 AM  GAD 7 : Generalized Anxiety Score  Nervous, Anxious, on Edge 1 3  Control/stop worrying 1 3  Worry too much - different things 0 3  Trouble relaxing 1 3  Restless 0 3  Easily annoyed or irritable 1 3  Afraid - awful might happen 1 1  Total GAD 7 Score 5 19  Anxiety Difficulty Not difficult at all Somewhat difficult      Relevant past medical, surgical, family and social history reviewed and updated as indicated. Interim medical history since our last visit reviewed. Allergies and medications reviewed and updated. Outpatient Medications Prior to Visit  Medication Sig Dispense Refill   hydrochlorothiazide (HYDRODIURIL) 12.5 MG tablet Take 1 tablet (12.5 mg total) by mouth daily. 90 tablet 1   Insulin Pen Needle (B-D UF III MINI PEN NEEDLES) 31G X 5 MM MISC To use with Saxenda pen     SAXENDA 18 MG/3ML SOPN SMARTSIG:3 Milligram(s) Topical Daily     sertraline (ZOLOFT) 50 MG tablet Take 1 tablet (50 mg total) by mouth daily. THIS IS A DOSE CHANGE 30 tablet 0   phentermine (ADIPEX-P) 37.5 MG tablet Take 18.75 mg by mouth daily.     buPROPion (WELLBUTRIN XL) 150 MG 24 hr tablet TAKE 1 TABLET BY MOUTH EVERY DAY (Patient not taking: Reported on 03/29/2022)  90 tablet 0   LORazepam (ATIVAN) 1 MG tablet Take 1 mg by mouth 3 (three) times daily as needed. (Patient not taking: Reported on 03/29/2022)     No facility-administered medications prior to visit.     Per HPI unless specifically indicated in ROS section below Review of Systems  Constitutional:  Negative for chills and fever.  Psychiatric/Behavioral:  Negative for hallucinations and suicidal ideas. The patient is nervous/anxious.    Objective:  Ht '5\' 1"'$  (1.549 m)   Wt 172 lb (78 kg)   BMI 32.50 kg/m   Wt Readings from Last 3 Encounters:  03/29/22 172 lb (78 kg)  02/15/22 170 lb 8 oz (77.3 kg)  09/16/19 175 lb (79.4 kg)       Physical exam: Gen: alert, NAD, not ill  appearing Pulm: speaks in complete sentences without increased work of breathing Psych: normal mood, normal thought content      Results for orders placed or performed in visit on 02/15/22  CBC  Result Value Ref Range   WBC 6.0 4.0 - 10.5 K/uL   RBC 4.71 3.87 - 5.11 Mil/uL   Platelets 216.0 150.0 - 400.0 K/uL   Hemoglobin 13.7 12.0 - 15.0 g/dL   HCT 39.9 36.0 - 46.0 %   MCV 84.7 78.0 - 100.0 fl   MCHC 34.3 30.0 - 36.0 g/dL   RDW 15.5 11.5 - 15.5 %  Hemoglobin A1c  Result Value Ref Range   Hgb A1c MFr Bld 5.9 4.6 - 6.5 %  Comprehensive metabolic panel  Result Value Ref Range   Sodium 141 135 - 145 mEq/L   Potassium 4.1 3.5 - 5.1 mEq/L   Chloride 103 96 - 112 mEq/L   CO2 33 (H) 19 - 32 mEq/L   Glucose, Bld 83 70 - 99 mg/dL   BUN 15 6 - 23 mg/dL   Creatinine, Ser 0.89 0.40 - 1.20 mg/dL   Total Bilirubin 0.7 0.2 - 1.2 mg/dL   Alkaline Phosphatase 69 39 - 117 U/L   AST 19 0 - 37 U/L   ALT 20 0 - 35 U/L   Total Protein 7.0 6.0 - 8.3 g/dL   Albumin 4.4 3.5 - 5.2 g/dL   GFR 70.67 >60.00 mL/min   Calcium 9.5 8.4 - 10.5 mg/dL  TSH  Result Value Ref Range   TSH 0.63 0.35 - 5.50 uIU/mL  VITAMIN D 25 Hydroxy (Vit-D Deficiency, Fractures)  Result Value Ref Range   VITD 22.79 (L) 30.00 - 100.00 ng/mL  Lipid panel  Result Value Ref Range   Cholesterol 208 (H) 0 - 200 mg/dL   Triglycerides 87.0 0.0 - 149.0 mg/dL   HDL 75.00 >39.00 mg/dL   VLDL 17.4 0.0 - 40.0 mg/dL   LDL Cholesterol 116 (H) 0 - 99 mg/dL   Total CHOL/HDL Ratio 3    NonHDL 133.13    Assessment & Plan:   Problem List Items Addressed This Visit       Other   Depression, major, single episode, severe ([place]) - Primary    Patient currently maintained on sertraline 50 mg daily.  Tolerating medicine well.  Had a marked decrease in her PHQ-9 and GAD-7 scores.  Patient seems to be doing better.  States she does have motivation to get up and to start her day.  Also sleeping better.  Continue medication as prescribed.   Patient will journal to give an objective tally of how often she is feeling anxious about today via MyChart.  No orders of the defined types were placed in this encounter.  No orders of the defined types were placed in this encounter.   I discussed the assessment and treatment plan with the patient. The patient was provided an opportunity to ask questions and all were answered. The patient agreed with the plan and demonstrated an understanding of the instructions. The patient was advised to call back or seek an in-person evaluation if the symptoms worsen or if the condition fails to improve as anticipated.  Follow up plan: Return in about 6 months (around 09/28/2022) for CPE and labs.  Romilda Garret, NP

## 2022-03-29 NOTE — Patient Instructions (Signed)
Nice to see you today We will make no changes to the current medication list Follow up with me in 4-6 months for your physical, sooner if you need me

## 2022-03-29 NOTE — Assessment & Plan Note (Signed)
Patient currently maintained on sertraline 50 mg daily.  Tolerating medicine well.  Had a marked decrease in her PHQ-9 and GAD-7 scores.  Patient seems to be doing better.  States she does have motivation to get up and to start her day.  Also sleeping better.  Continue medication as prescribed.  Patient will journal to give an objective tally of how often she is feeling anxious about today via MyChart.

## 2022-04-05 ENCOUNTER — Ambulatory Visit: Payer: BC Managed Care – PPO | Admitting: Clinical

## 2022-08-14 ENCOUNTER — Other Ambulatory Visit: Payer: Self-pay | Admitting: Nurse Practitioner

## 2022-08-14 DIAGNOSIS — Z760 Encounter for issue of repeat prescription: Secondary | ICD-10-CM

## 2022-08-15 NOTE — Telephone Encounter (Signed)
LVM for patient to cb and sch.  

## 2022-09-15 NOTE — Telephone Encounter (Signed)
Called patient set up for follow up next week. Has questions about immunizations for travel as well.

## 2022-09-20 ENCOUNTER — Ambulatory Visit: Payer: BC Managed Care – PPO | Admitting: Nurse Practitioner

## 2022-09-20 ENCOUNTER — Encounter: Payer: Self-pay | Admitting: Nurse Practitioner

## 2022-09-20 VITALS — BP 122/76 | HR 76 | Temp 98.2°F | Resp 16 | Ht 61.0 in | Wt 176.4 lb

## 2022-09-20 DIAGNOSIS — F322 Major depressive disorder, single episode, severe without psychotic features: Secondary | ICD-10-CM

## 2022-09-20 DIAGNOSIS — Z7184 Encounter for health counseling related to travel: Secondary | ICD-10-CM

## 2022-09-20 DIAGNOSIS — Z23 Encounter for immunization: Secondary | ICD-10-CM | POA: Diagnosis not present

## 2022-09-20 DIAGNOSIS — Z2989 Encounter for other specified prophylactic measures: Secondary | ICD-10-CM

## 2022-09-20 DIAGNOSIS — Z1231 Encounter for screening mammogram for malignant neoplasm of breast: Secondary | ICD-10-CM

## 2022-09-20 DIAGNOSIS — I10 Essential (primary) hypertension: Secondary | ICD-10-CM | POA: Diagnosis not present

## 2022-09-20 DIAGNOSIS — Z0000 Encounter for general adult medical examination without abnormal findings: Secondary | ICD-10-CM | POA: Insufficient documentation

## 2022-09-20 HISTORY — DX: Encounter for health counseling related to travel: Z71.84

## 2022-09-20 LAB — COMPREHENSIVE METABOLIC PANEL
ALT: 27 U/L (ref 0–35)
AST: 29 U/L (ref 0–37)
Albumin: 4.3 g/dL (ref 3.5–5.2)
Alkaline Phosphatase: 68 U/L (ref 39–117)
BUN: 18 mg/dL (ref 6–23)
CO2: 22 mEq/L (ref 19–32)
Calcium: 9.3 mg/dL (ref 8.4–10.5)
Chloride: 105 mEq/L (ref 96–112)
Creatinine, Ser: 0.93 mg/dL (ref 0.40–1.20)
GFR: 66.76 mL/min (ref >60.00)
Glucose, Bld: 96 mg/dL (ref 70–99)
Potassium: 4 mEq/L (ref 3.5–5.1)
Sodium: 140 mEq/L (ref 135–145)
Total Bilirubin: 0.7 mg/dL (ref 0.2–1.2)
Total Protein: 7.2 g/dL (ref 6.0–8.3)

## 2022-09-20 LAB — CBC
HCT: 41.3 % (ref 36.0–46.0)
Hemoglobin: 14 g/dL (ref 12.0–15.0)
MCHC: 33.9 g/dL (ref 30.0–36.0)
MCV: 86 fl (ref 78.0–100.0)
Platelets: 215 10*3/uL (ref 150.0–400.0)
RBC: 4.81 Mil/uL (ref 3.87–5.11)
RDW: 15.6 % — ABNORMAL HIGH (ref 11.5–15.5)
WBC: 6 10*3/uL (ref 4.0–10.5)

## 2022-09-20 NOTE — Assessment & Plan Note (Signed)
Patient will be traveling to [place] later in the month.  She would like to have malaria prophylaxis.  Pending LFTs today.  Discussed that yellow fever is not endemic patient would not like to have that vaccination if she decides she would she can go get it at the local health department.  Unsure of meningitis is a big risk factor where she is was to have a meeting coming up for more details and so we can administer the meningitis vaccine.  Did discuss using only safe sources of water

## 2022-09-20 NOTE — Assessment & Plan Note (Signed)
Patient currently off all antihypertensive medications states she is checking her blood pressure at home.  Blood pressure within normal limits today

## 2022-09-20 NOTE — Progress Notes (Signed)
Acute Office Visit  Subjective:     Patient ID: Diane Cuadrado, female    DOB: March 01, 1961, 62 y.o.   MRN: [id number]  Chief Complaint  Patient presents with   Travel Consult    HPI Patient is in today for travel consult  June 24 and July 5th. [place]. States she is suppose to have a meeting with more details in the near future.    HTN: states that she is off the hctz for approx 4 months that she is checking her blood pressure at home. Blood pressure is wnl in office   Depression: states that she has been off the sertraline for four months. States that she did not have any issue coming off the medication. States that she is doing ok. She is still not interested in therapy at this juncture   Mammogram: [place]. Needs updating states the last time was at [facility] and has been a few years   Pap smear: always normal over 5 years ago per patient report  Colonoscopy: 2021 repeat in 10 years   TDAP: unsure, update today  Covid: moderna     Review of Systems  Constitutional:  Negative for chills and fever.  Respiratory:  Negative for shortness of breath.   Cardiovascular:  Negative for chest pain.  Neurological:  Negative for headaches.        Objective:    BP 122/76   Pulse 76   Temp 98.2 F (36.8 C)   Resp 16   Ht 5\' 1"  (1.549 m)   Wt 176 lb 6 oz (80 kg)   SpO2 98%   BMI 33.33 kg/m  BP Readings from Last 3 Encounters:  09/20/22 122/76  02/15/22 130/76  09/16/19 133/85   Wt Readings from Last 3 Encounters:  09/20/22 176 lb 6 oz (80 kg)  03/29/22 172 lb (78 kg)  02/15/22 170 lb 8 oz (77.3 kg)      Physical Exam Vitals and nursing note reviewed.  Constitutional:      Appearance: Normal appearance.  Cardiovascular:     Rate and Rhythm: Normal rate and regular rhythm.     Heart sounds: Normal heart sounds.  Pulmonary:     Effort: Pulmonary effort is normal.     Breath sounds: Normal breath sounds.  Neurological:     Mental Status: She  is alert.     No results found for any visits on 09/20/22.      Assessment & Plan:   Problem List Items Addressed This Visit       Cardiovascular and Mediastinum   Primary hypertension    Patient currently off all antihypertensive medications states she is checking her blood pressure at home.  Blood pressure within normal limits today      Relevant Orders   CBC   Comprehensive metabolic panel     Other   Depression, major, single episode, severe (HCC)    Patient states she has been off medication for 3 to 4 months.  Doing okay per her report.  She does not want to pursue therapy at this juncture she can reach out at any point in time and I will refer her to therapy if she would like to participate      Travel advice encounter - Primary    Patient will be traveling to [place] later in the month.  She would like to have malaria prophylaxis.  Pending LFTs today.  Discussed that yellow fever is not endemic patient would  not like to have that vaccination if she decides she would she can go get it at the local health department.  Unsure of meningitis is a big risk factor where she is was to have a meeting coming up for more details and so we can administer the meningitis vaccine.  Did discuss using only safe sources of water      Relevant Orders   CBC   Comprehensive metabolic panel   Other Visit Diagnoses     Need for malaria prophylaxis       Relevant Orders   CBC   Comprehensive metabolic panel   Screening mammogram for breast cancer       Relevant Orders   MM 3D SCREENING MAMMOGRAM BILATERAL BREAST   Need for tetanus booster       Relevant Orders   Tdap vaccine greater than or equal to 7yo IM (Completed)       No orders of the defined types were placed in this encounter.   Return in about 4 months (around 01/20/2023) for CPE and Labs.  Audria Nine, NP

## 2022-09-20 NOTE — Patient Instructions (Signed)
Nice to see you today Once I have labs back I will send in the malaria medication Call and get your mammogram scheduled.   We can get you in for your physical and pap smear in the next few months

## 2022-09-20 NOTE — Assessment & Plan Note (Signed)
Patient states she has been off medication for 3 to 4 months.  Doing okay per her report.  She does not want to pursue therapy at this juncture she can reach out at any point in time and I will refer her to therapy if she would like to participate

## 2022-09-21 ENCOUNTER — Other Ambulatory Visit: Payer: Self-pay | Admitting: Nurse Practitioner

## 2022-09-21 DIAGNOSIS — Z2989 Encounter for other specified prophylactic measures: Secondary | ICD-10-CM

## 2022-09-21 MED ORDER — ATOVAQUONE-PROGUANIL HCL 250-100 MG PO TABS: ORAL_TABLET | ORAL | 0 refills | Status: DC

## 2022-10-15 ENCOUNTER — Other Ambulatory Visit: Payer: Self-pay | Admitting: Nurse Practitioner

## 2022-10-15 DIAGNOSIS — F322 Major depressive disorder, single episode, severe without psychotic features: Secondary | ICD-10-CM

## 2022-10-16 NOTE — Telephone Encounter (Signed)
Can we get the patient scheduled per my last office note and then send the medication back and I will refill it

## 2022-10-18 NOTE — Telephone Encounter (Signed)
LVMTCB and schedule

## 2022-10-20 NOTE — Telephone Encounter (Signed)
Lvmtcb, sent mychart message  

## 2022-11-02 ENCOUNTER — Other Ambulatory Visit: Payer: Self-pay | Admitting: Nurse Practitioner

## 2022-11-02 DIAGNOSIS — F322 Major depressive disorder, single episode, severe without psychotic features: Secondary | ICD-10-CM

## 2022-12-12 ENCOUNTER — Other Ambulatory Visit: Payer: Self-pay | Admitting: Nurse Practitioner

## 2022-12-12 ENCOUNTER — Other Ambulatory Visit: Payer: Self-pay

## 2022-12-12 DIAGNOSIS — Z1231 Encounter for screening mammogram for malignant neoplasm of breast: Secondary | ICD-10-CM

## 2022-12-15 ENCOUNTER — Ambulatory Visit
Admission: RE | Admit: 2022-12-15 | Discharge: 2022-12-15 | Disposition: A | Discharge status: Home / Self Care | Payer: BC Managed Care – PPO | Source: Ambulatory Visit

## 2022-12-15 DIAGNOSIS — Z1231 Encounter for screening mammogram for malignant neoplasm of breast: Secondary | ICD-10-CM

## 2022-12-20 ENCOUNTER — Other Ambulatory Visit: Payer: Self-pay | Admitting: Nurse Practitioner

## 2022-12-20 DIAGNOSIS — R928 Other abnormal and inconclusive findings on diagnostic imaging of breast: Secondary | ICD-10-CM

## 2022-12-26 ENCOUNTER — Ambulatory Visit
Admission: RE | Admit: 2022-12-26 | Discharge: 2022-12-26 | Disposition: A | Discharge status: Home / Self Care | Payer: BC Managed Care – PPO | Source: Ambulatory Visit | Attending: Nurse Practitioner | Admitting: Nurse Practitioner

## 2022-12-26 DIAGNOSIS — R928 Other abnormal and inconclusive findings on diagnostic imaging of breast: Secondary | ICD-10-CM

## 2023-01-22 ENCOUNTER — Encounter: Payer: Self-pay | Admitting: Nurse Practitioner

## 2023-01-22 ENCOUNTER — Ambulatory Visit (INDEPENDENT_AMBULATORY_CARE_PROVIDER_SITE_OTHER): Payer: BC Managed Care – PPO | Admitting: Nurse Practitioner

## 2023-01-22 ENCOUNTER — Other Ambulatory Visit (HOSPITAL_COMMUNITY)
Admission: RE | Admit: 2023-01-22 | Discharge: 2023-01-22 | Disposition: A | Discharge status: Home / Self Care | Payer: BC Managed Care – PPO | Source: Ambulatory Visit | Attending: Nurse Practitioner | Admitting: Nurse Practitioner

## 2023-01-22 VITALS — BP 120/88 | HR 64 | Temp 97.9°F | Ht 62.0 in | Wt 154.6 lb

## 2023-01-22 DIAGNOSIS — R7303 Prediabetes: Secondary | ICD-10-CM | POA: Diagnosis not present

## 2023-01-22 DIAGNOSIS — I10 Essential (primary) hypertension: Secondary | ICD-10-CM

## 2023-01-22 DIAGNOSIS — F322 Major depressive disorder, single episode, severe without psychotic features: Secondary | ICD-10-CM

## 2023-01-22 DIAGNOSIS — Z0000 Encounter for general adult medical examination without abnormal findings: Secondary | ICD-10-CM | POA: Diagnosis not present

## 2023-01-22 DIAGNOSIS — Z124 Encounter for screening for malignant neoplasm of cervix: Secondary | ICD-10-CM

## 2023-01-22 DIAGNOSIS — E559 Vitamin D deficiency, unspecified: Secondary | ICD-10-CM | POA: Diagnosis not present

## 2023-01-22 DIAGNOSIS — E7800 Pure hypercholesterolemia, unspecified: Secondary | ICD-10-CM | POA: Diagnosis not present

## 2023-01-22 LAB — LIPID PANEL
Cholesterol: 225 mg/dL — ABNORMAL HIGH (ref 0–200)
HDL: 71.5 mg/dL (ref >39.00)
LDL Cholesterol: 135 mg/dL — ABNORMAL HIGH (ref 0–99)
NonHDL: 153.14
Total CHOL/HDL Ratio: 3
Triglycerides: 91 mg/dL (ref 0.0–149.0)
VLDL: 18.2 mg/dL (ref 0.0–40.0)

## 2023-01-22 LAB — COMPREHENSIVE METABOLIC PANEL
ALT: 15 U/L (ref 0–35)
AST: 20 U/L (ref 0–37)
Albumin: 4.3 g/dL (ref 3.5–5.2)
Alkaline Phosphatase: 79 U/L (ref 39–117)
BUN: 13 mg/dL (ref 6–23)
CO2: 27 meq/L (ref 19–32)
Calcium: 9.5 mg/dL (ref 8.4–10.5)
Chloride: 106 meq/L (ref 96–112)
Creatinine, Ser: 0.94 mg/dL (ref 0.40–1.20)
GFR: 65.75 mL/min (ref >60.00)
Glucose, Bld: 83 mg/dL (ref 70–99)
Potassium: 4.2 meq/L (ref 3.5–5.1)
Sodium: 141 meq/L (ref 135–145)
Total Bilirubin: 1 mg/dL (ref 0.2–1.2)
Total Protein: 6.8 g/dL (ref 6.0–8.3)

## 2023-01-22 LAB — CBC
HCT: 42 % (ref 36.0–46.0)
Hemoglobin: 14.2 g/dL (ref 12.0–15.0)
MCHC: 33.9 g/dL (ref 30.0–36.0)
MCV: 86.9 fL (ref 78.0–100.0)
Platelets: 216 10*3/uL (ref 150.0–400.0)
RBC: 4.83 Mil/uL (ref 3.87–5.11)
RDW: 16 % — ABNORMAL HIGH (ref 11.5–15.5)
WBC: 5.2 10*3/uL (ref 4.0–10.5)

## 2023-01-22 LAB — HEMOGLOBIN A1C: Hgb A1c MFr Bld: 5.5 % (ref 4.6–6.5)

## 2023-01-22 LAB — VITAMIN D 25 HYDROXY (VIT D DEFICIENCY, FRACTURES): VITD: 29.48 ng/mL — ABNORMAL LOW (ref 30.00–100.00)

## 2023-01-22 LAB — TSH: TSH: 0.83 u[IU]/mL (ref 0.35–5.50)

## 2023-01-22 NOTE — Patient Instructions (Signed)
Nice to see you today I will be in touch with the labs once I have them Follow up with me in 1 year, sooner If you need

## 2023-01-22 NOTE — Assessment & Plan Note (Signed)
History of the same.  Pending vitamin D level today

## 2023-01-22 NOTE — Assessment & Plan Note (Signed)
Did discuss age-appropriate immunizations and screening exams.  Did review patient's personal, surgical, social, family histories.  Patient up-to-date on all age-appropriate vaccinations she would like.  Patient refused flu vaccine and shingles vaccine.  Patient is up-to-date on CRC screening and breast cancer screening.  We did update patient's Pap smear today for cervical cancer screening.  Patient was given information at discharge about preventative healthcare maintenance with anticipatory guidance.

## 2023-01-22 NOTE — Progress Notes (Signed)
Established Patient Office Visit  Subjective   Patient ID: Diane Jenkins, female    DOB: May 26, 1960  Age: 62 y.o. MRN: [id number]  Chief Complaint  Patient presents with   Annual Exam    HPI  HTN: currently on hydrochlorothiazide. Been off medication for approx 4 months. Does not check blood pressure at home   Depressoin: currently on zoloft and wellbutrin. Not on any medication currently.  She was referred to therapy but had a long waiting list and patient never followed back up.  Patient denies HI/SI/AVH.  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  Also states that her husband was recently diagnosed with prostate cancer  for complete physical and follow up of chronic conditions.  Immunizations: -Tetanus: Completed in 2024 -Influenza: defer -Shingles: refused -Pneumonia: too young -covid: original series  Diet: Fair diet. States that she is eating a protein shake in the am and sometimes she wil lhave lunch. She will do a good dinner. Cut out alchohol at home. Cut out candy   Exercise: Walks 5 miles a day   Eye exam: Completes annually. Glasses Dental exam: Completes semi-annually    Colonoscopy: Completed in 09/16/2019, repeat 10 years Lung Cancer Screening: N/A  Pap semar: Over to update today  Mammogram: 12/26/2022  Sleep: states in bed around 11 and up aroun 430-445. Feels rested sometimes does not sleep well. States that she thinks she snores        Review of Systems  Constitutional:  Negative for chills and fever.  Respiratory:  Negative for shortness of breath.   Cardiovascular:  Negative for chest pain and leg swelling.  Gastrointestinal:  Negative for abdominal pain, blood in stool, constipation, diarrhea, nausea and vomiting.       BM daily   Genitourinary:  Negative for dysuria and hematuria.  Neurological:  Negative for tingling and headaches.  Psychiatric/Behavioral:  Negative for  hallucinations and suicidal ideas.       Objective:     BP 120/88   Pulse 64   Temp 97.9 F (36.6 C) (Oral)   Ht 5\' 2"  (1.575 m)   Wt 154 lb 9.6 oz (70.1 kg)   SpO2 96%   BMI 28.28 kg/m  BP Readings from Last 3 Encounters:  01/22/23 120/88  09/20/22 122/76  02/15/22 130/76   Wt Readings from Last 3 Encounters:  01/22/23 154 lb 9.6 oz (70.1 kg)  09/20/22 176 lb 6 oz (80 kg)  03/29/22 172 lb (78 kg)      Physical Exam Vitals and nursing note reviewed. Exam conducted with a chaperone present Melina Copa, CMA).  Constitutional:      Appearance: Normal appearance.  HENT:     Right Ear: Tympanic membrane, ear canal and external ear normal.     Left Ear: Tympanic membrane, ear canal and external ear normal.     Mouth/Throat:     Mouth: Mucous membranes are moist.     Pharynx: Oropharynx is clear.  Eyes:     Extraocular Movements: Extraocular movements intact.     Pupils: Pupils are equal, round, and reactive to light.  Cardiovascular:     Rate and Rhythm: Normal rate and regular rhythm.     Pulses: Normal pulses.     Heart sounds: Normal heart sounds.  Pulmonary:     Effort: Pulmonary effort is normal.     Breath sounds: Normal breath sounds.  Abdominal:     General:  Bowel sounds are normal. There is no distension.     Palpations: There is no mass.     Tenderness: There is no abdominal tenderness.     Hernia: No hernia is present.  Genitourinary:    Exam position: Lithotomy position.     Vagina: Normal.     Cervix: Normal.     Uterus: Normal.      Adnexa: Right adnexa normal and left adnexa normal.  Musculoskeletal:     Right lower leg: No edema.     Left lower leg: No edema.  Lymphadenopathy:     Cervical: No cervical adenopathy.  Skin:    General: Skin is warm.  Neurological:     General: No focal deficit present.     Mental Status: She is alert.     Deep Tendon Reflexes:     Reflex Scores:      Bicep reflexes are 2+ on the right side and 2+ on  the left side.      Patellar reflexes are 2+ on the right side and 2+ on the left side.    Comments: Bilateral upper and lower extremity strength 5/5  Psychiatric:        Mood and Affect: Mood normal.        Behavior: Behavior normal.        Thought Content: Thought content normal.        Judgment: Judgment normal.      No results found for any visits on 01/22/23.    The 10-year ASCVD risk score (Arnett DK, et al., 2019) is: 4.9%    Assessment & Plan:   Problem List Items Addressed This Visit       Cardiovascular and Mediastinum   Primary hypertension    Patient was on hydrochlorothiazide.  She has lost weight on her own and has been off medication for several months.  Patient's blood pressure well-controlled she can spot check at home we will discontinue hydrochlorothiazide at this juncture and manage lifestyle modifications only      Relevant Orders   CBC   Comprehensive metabolic panel   TSH     Other   Vitamin D deficiency    History of the same.  Pending vitamin D level today      Relevant Orders   VITAMIN D 25 Hydroxy (Vit-D Deficiency, Fractures)   Pre-diabetes    History of the same.  Pending A1c.  Patient has lost weight      Relevant Orders   Hemoglobin A1c   Depression, major, single episode, severe (HCC)    Patient not currently on any medications.  Not currently in therapy.  Patient denies HI/SI/AVH.  Patient will reach out if anything changes or she would like to pursue treatments      Relevant Medications   buPROPion (WELLBUTRIN XL) 150 MG 24 hr tablet   Preventative health care - Primary    Did discuss age-appropriate immunizations and screening exams.  Did review patient's personal, surgical, social, family histories.  Patient up-to-date on all age-appropriate vaccinations she would like.  Patient refused flu vaccine and shingles vaccine.  Patient is up-to-date on CRC screening and breast cancer screening.  We did update patient's Pap smear today  for cervical cancer screening.  Patient was given information at discharge about preventative healthcare maintenance with anticipatory guidance.      Relevant Orders   CBC   Comprehensive metabolic panel   TSH   Elevated LDL cholesterol level    History of  the same pending lipid panel today      Relevant Orders   Lipid panel   Other Visit Diagnoses     Cervical cancer screening       Relevant Orders   Cytology - PAP([place])       Return in about 1 year (around 01/22/2024) for CPE and Labs.    Audria Nine, NP

## 2023-01-22 NOTE — Assessment & Plan Note (Signed)
History of the same.  Pending A1c.  Patient has lost weight

## 2023-01-22 NOTE — Assessment & Plan Note (Signed)
History of the same pending lipid panel today

## 2023-01-22 NOTE — Assessment & Plan Note (Signed)
Patient not currently on any medications.  Not currently in therapy.  Patient denies HI/SI/AVH.  Patient will reach out if anything changes or she would like to pursue treatments

## 2023-01-22 NOTE — Assessment & Plan Note (Signed)
Patient was on hydrochlorothiazide.  She has lost weight on her own and has been off medication for several months.  Patient's blood pressure well-controlled she can spot check at home we will discontinue hydrochlorothiazide at this juncture and manage lifestyle modifications only

## 2023-01-26 LAB — CYTOLOGY - PAP
Adequacy: ABSENT
Comment: NEGATIVE
Diagnosis: NEGATIVE
High risk HPV: NEGATIVE

## 2024-01-11 ENCOUNTER — Encounter: Payer: Self-pay | Admitting: General Practice

## 2024-01-11 ENCOUNTER — Ambulatory Visit: Admitting: General Practice

## 2024-01-11 ENCOUNTER — Ambulatory Visit: Payer: Self-pay

## 2024-01-11 VITALS — BP 122/82 | HR 67 | Temp 97.8°F | Ht 62.0 in | Wt 146.2 lb

## 2024-01-11 DIAGNOSIS — B029 Zoster without complications: Secondary | ICD-10-CM

## 2024-01-11 MED ORDER — VALACYCLOVIR HCL 1 G PO TABS
1000.0000 mg | ORAL_TABLET | Freq: Three times a day (TID) | ORAL | 0 refills | Status: AC
Start: 2024-01-11 — End: 2024-01-18

## 2024-01-11 MED ORDER — GABAPENTIN 300 MG PO CAPS
300.0000 mg | ORAL_CAPSULE | Freq: Two times a day (BID) | ORAL | 3 refills | Status: AC | PRN
Start: 2024-01-11 — End: ?

## 2024-01-11 NOTE — Telephone Encounter (Signed)
 FYI Only or Action Required?: FYI only for provider.  Patient was last seen in primary care on 01/22/2023 by Wendee Lynwood HERO, NP.  Called Nurse Triage reporting Rash.  Symptoms began a week ago.  Interventions attempted: Other: stopped shaving and wearing deodorant.  Symptoms are: gradually worsening.  Triage Disposition: See Physician Within 24 Hours  Patient/caregiver understands and will follow disposition?: Yes Reason for Disposition  [1] Localized rash is very painful AND [2] no fever  Answer Assessment - Initial Assessment Questions 1. APPEARANCE of RASH: What does the rash look like?      Red spots   2. LOCATION: Where is the rash located?      Left axilla, worse when sweating  3. ONSET: When did the rash start?      5 days  4. ITCHING: Does the rash itch? If Yes, ask: How bad is the itch?  (Scale 1-10; or mild, moderate, severe)     Denies  5. PAIN: Does the rash hurt? If Yes, ask: How bad is the pain?  (Scale 0-10; or none, mild, moderate, severe)     Feels like irritated rash  6. OTHER SYMPTOMS: Do you have any other symptoms? (e.g., fever)     Denies  Protocols used: Rash or Redness - Localized-A-AH Copied from CRM #[id number]. Topic: Clinical - Red Word Triage >> Jan 11, 2024 12:53 PM Chiquita SQUIBB wrote: Red Word that prompted transfer to Nurse Triage: Patient is calling in with severe pain under her left arm, she states it is hard to put her arm down.

## 2024-01-11 NOTE — Progress Notes (Signed)
 Established Patient Office Visit  Subjective   Patient ID: Diane Jenkins, female    DOB: 1961/04/08  Age: 63 y.o. MRN: [id number]  Chief Complaint  Patient presents with   Rash    On Left axilla x 1 week. Patient states rash has become very painful since Monday. Patient has not been able to shave and has not been able to put anything on it. Pain is in arm and other surrounding areas around the rash.     Rash Pertinent negatives include no diarrhea, fever, shortness of breath or vomiting.   Discussed the use of AI scribe software for clinical note transcription with the patient, who gave verbal consent to proceed.  History of Present Illness Diane Jenkins is a 63 year old female, patient of Adina Crandall, NP, who presents with a painful rash under her left armpit.  The rash under her left armpit became more noticeable over the past weekend, with pain beginning on Monday. The pain is localized to the armpit but can be felt when pressing around the area.  She has not observed any pus or fluid in the rash and is hesitant to touch it. Attempts to visually inspect the rash have been unsuccessful due to its location. The patient does not think the rash is located elsewhere on her body, but she is unsure and has not been able to look.  No fever, chills, shortness of breath, difficulty breathing, constipation, diarrhea, nausea, or urinary symptoms.   Patient Active Problem List   Diagnosis Date Noted   Elevated LDL cholesterol level 01/22/2023   Preventative health care 09/20/2022   Primary hypertension 02/15/2022   Depression, major, single episode, severe (HCC) 02/15/2022   Class 1 obesity due to excess calories without serious comorbidity with body mass index (BMI) of 32.0 to 32.9 in adult 02/15/2022   Depression 10/21/2020   Pre-diabetes 10/21/2020   ascvd risk 3.8% 06/16/2019   Vitamin D  deficiency 05/02/2019   Family history of breast cancer in mother 04/30/2019   Neutropenia     Past Medical History:  Diagnosis Date   Anemia    Anxiety    Cancer (HCC) 2012   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   Dengue fever    Depression    Hypertension 11/11/2018   Neutropenia    Thyroid  disease    not currently taking.   Travel advice encounter 09/20/2022   History reviewed. No pertinent surgical history. No Known Allergies       01/22/2023    8:51 AM 09/20/2022   10:01 AM 03/29/2022    4:25 PM  Depression screen PHQ 2/9  Decreased Interest 2 1 2   Down, Depressed, Hopeless 2 1 1   PHQ - 2 Score 4 2 3   Altered sleeping 2 2 0  Tired, decreased energy 1 1 1   Change in appetite 0 2 1  Feeling bad or failure about yourself  0 1 2  Trouble concentrating 1 1 0  Moving slowly or fidgety/restless 1 1 0  Suicidal thoughts 1 1 0  PHQ-9 Score 10 11 7   Difficult doing work/chores Somewhat difficult Somewhat difficult Not difficult at all       01/22/2023    8:51 AM 03/29/2022    4:27 PM 02/15/2022   10:42 AM  GAD 7 : Generalized Anxiety Score  Nervous, Anxious, on Edge 1 1 3   Control/stop worrying 2 1 3   Worry too much - different things 1 0 3  Trouble  relaxing 2 1 3   Restless 1 0 3  Easily annoyed or irritable 1 1 3   Afraid - awful might happen 1 1 1   Total GAD 7 Score 9 5 19   Anxiety Difficulty Somewhat difficult Not difficult at all Somewhat difficult      Review of Systems  Constitutional:  Negative for chills and fever.  Respiratory:  Negative for shortness of breath.   Cardiovascular:  Negative for chest pain.  Gastrointestinal:  Negative for abdominal pain, constipation, diarrhea, heartburn, nausea and vomiting.  Genitourinary:  Negative for dysuria, frequency and urgency.  Skin:  Positive for rash.  Neurological:  Negative for dizziness and headaches.  Endo/Heme/Allergies:  Negative for polydipsia.  Psychiatric/Behavioral:  Negative for depression and suicidal ideas. The patient is not nervous/anxious.       Objective:      BP 122/82   Pulse 67   Temp 97.8 F (36.6 C) (Temporal)   Ht 5' 2 (1.575 m)   Wt 146 lb 3.2 oz (66.3 kg)   SpO2 99%   BMI 26.74 kg/m  BP Readings from Last 3 Encounters:  01/11/24 122/82  01/22/23 120/88  09/20/22 122/76   Wt Readings from Last 3 Encounters:  01/11/24 146 lb 3.2 oz (66.3 kg)  01/22/23 154 lb 9.6 oz (70.1 kg)  09/20/22 176 lb 6 oz (80 kg)      Physical Exam Vitals and nursing note reviewed.  Constitutional:      Appearance: Normal appearance.  Cardiovascular:     Rate and Rhythm: Normal rate and regular rhythm.     Pulses: Normal pulses.     Heart sounds: Normal heart sounds.  Pulmonary:     Effort: Pulmonary effort is normal.     Breath sounds: Normal breath sounds.  Skin:    Findings: Rash present. Rash is vesicular.     Comments: Vesicular rash present under left axilla  Neurological:     Mental Status: She is alert and oriented to person, place, and time.  Psychiatric:        Mood and Affect: Mood normal.        Behavior: Behavior normal.        Thought Content: Thought content normal.        Judgment: Judgment normal.      No results found for any visits on 01/11/24.     The 10-year ASCVD risk score (Arnett DK, et al., 2019) is: 4.8%    Assessment & Plan:  Herpes zoster without complication -     Gabapentin ; Take 1-2 capsules (300-600 mg total) by mouth 2 (two) times daily as needed.  Dispense: 180 capsule; Refill: 3 -     valACYclovir  HCl; Take 1 tablet (1,000 mg total) by mouth 3 (three) times daily for 7 days.  Dispense: 21 tablet; Refill: 0    Assessment and Plan Assessment & Plan Herpes zoster (shingles) of left axilla/arm Herpes zoster confirmed with vesicular rash and nerve pain. Antiviral treatment less effective due to delayed presentation. Pain management required. - Start Valacyclovir  1000 mg TID for seven days.  - Start Gabapentin  300-600 mg as needed for pain. Sedation precautions discussed.  - Advised regarding  transmission and good hand hygiene.  - follow up symptoms worsen or do not improve.  Return if symptoms worsen or fail to improve.    Carrol Aurora, NP

## 2024-01-11 NOTE — Patient Instructions (Addendum)
 Start Valacylclovir 1000 mg three times a day for seven days.   Start Gabapentin  1-2 capsules as needed for pain. This can make you sleepy.   Follow up if symptoms worsen or do not improve.   It was a pleasure meeting you!

## 2024-01-11 NOTE — Telephone Encounter (Signed)
 noted

## 2024-01-22 ENCOUNTER — Telehealth: Admitting: Physician Assistant

## 2024-01-22 DIAGNOSIS — B0229 Other postherpetic nervous system involvement: Secondary | ICD-10-CM | POA: Diagnosis not present

## 2024-01-22 NOTE — Progress Notes (Signed)
 E-visit for Shingles   We are sorry that you are not feeling well. Here is how we plan to help!  Based on what you shared with me it looks like you have possible post-herpetic neuralgia. This occurs after an outbreak of shingles in which the rash is resolving but there is persistent burning/stinging pain in that area. This can last for a few days, few weeks, and in rare cases, longer. The risk for this increases with age and with delayed treatment of shingles rash at onset of symptoms.  Please continue your Gabapentin  as prescribed by your PCP office and make sure to schedule a follow-up with them so they can monitor over time.  GET HELP RIGHT AWAY IF: Symptoms that don't away after treatment. A rash or blisters near your eye. Increased drainage, fever, or rash after treatment. Severe pain that doesn't go away.   MAKE SURE YOU   Understand these instructions. Will watch your condition. Will get help right away if you are not doing well or get worse.  Thank you for choosing an e-visit. Your e-visit answers were reviewed by a board certified advanced clinical practitioner to complete your personal care plan. Depending upon the condition, your plan could have included both over the counter or prescription medications.  Please review your pharmacy choice. Make sure the pharmacy is open so you can pick up prescription now. If there is a problem, you may contact your provider through [employer] and have the prescription routed to another pharmacy.  Your safety is important to us . If you have drug allergies check your prescription carefully.   For the next 24 hours you can use MyChart to ask questions about today's visit, request a non-urgent call back, or ask for a work or school excuse.  You will get an email in the next two days asking about your experience. I hope that your e-visit has been valuable and will speed your recovery  I have spent 5 minutes in review of e-visit  questionnaire, review and updating patient chart, medical decision making and response to patient.   Elsie Velma Lunger, PA-C

## 2024-01-24 ENCOUNTER — Encounter: Payer: BC Managed Care – PPO | Admitting: Nurse Practitioner

## 2024-05-23 ENCOUNTER — Ambulatory Visit: Admitting: Nurse Practitioner

## 2024-05-23 ENCOUNTER — Encounter: Payer: Self-pay | Admitting: Nurse Practitioner

## 2024-05-23 VITALS — BP 110/80 | HR 85 | Temp 97.7°F | Ht 60.0 in | Wt 155.2 lb

## 2024-05-23 DIAGNOSIS — E7800 Pure hypercholesterolemia, unspecified: Secondary | ICD-10-CM

## 2024-05-23 DIAGNOSIS — Z1231 Encounter for screening mammogram for malignant neoplasm of breast: Secondary | ICD-10-CM

## 2024-05-23 DIAGNOSIS — I10 Essential (primary) hypertension: Secondary | ICD-10-CM

## 2024-05-23 DIAGNOSIS — Z0000 Encounter for general adult medical examination without abnormal findings: Secondary | ICD-10-CM

## 2024-05-23 DIAGNOSIS — E559 Vitamin D deficiency, unspecified: Secondary | ICD-10-CM

## 2024-05-23 DIAGNOSIS — R7303 Prediabetes: Secondary | ICD-10-CM

## 2024-05-23 DIAGNOSIS — F322 Major depressive disorder, single episode, severe without psychotic features: Secondary | ICD-10-CM

## 2024-05-23 LAB — CBC WITH DIFFERENTIAL/PLATELET
Basophils Absolute: 0 10*3/uL (ref 0.0–0.1)
Basophils Relative: 0.8 % (ref 0.0–3.0)
Eosinophils Absolute: 0.1 10*3/uL (ref 0.0–0.7)
Eosinophils Relative: 2.3 % (ref 0.0–5.0)
HCT: 40.8 % (ref 36.0–46.0)
Hemoglobin: 14 g/dL (ref 12.0–15.0)
Lymphocytes Relative: 34.4 % (ref 12.0–46.0)
Lymphs Abs: 1.6 10*3/uL (ref 0.7–4.0)
MCHC: 34.3 g/dL (ref 30.0–36.0)
MCV: 86.1 fl (ref 78.0–100.0)
Monocytes Absolute: 0.3 10*3/uL (ref 0.1–1.0)
Monocytes Relative: 6.7 % (ref 3.0–12.0)
Neutro Abs: 2.6 10*3/uL (ref 1.4–7.7)
Neutrophils Relative %: 55.8 % (ref 43.0–77.0)
Platelets: 187 10*3/uL (ref 150.0–400.0)
RBC: 4.74 Mil/uL (ref 3.87–5.11)
RDW: 15.1 % (ref 11.5–15.5)
WBC: 4.6 10*3/uL (ref 4.0–10.5)

## 2024-05-23 LAB — LIPID PANEL
Cholesterol: 227 mg/dL — ABNORMAL HIGH (ref 28–200)
HDL: 74.6 mg/dL
LDL Cholesterol: 141 mg/dL — ABNORMAL HIGH (ref 10–99)
NonHDL: 152.22
Total CHOL/HDL Ratio: 3
Triglycerides: 58 mg/dL (ref 10.0–149.0)
VLDL: 11.6 mg/dL (ref 0.0–40.0)

## 2024-05-23 LAB — COMPREHENSIVE METABOLIC PANEL WITH GFR
ALT: 19 U/L (ref 3–35)
AST: 23 U/L (ref 5–37)
Albumin: 4.4 g/dL (ref 3.5–5.2)
Alkaline Phosphatase: 75 U/L (ref 39–117)
BUN: 19 mg/dL (ref 6–23)
CO2: 31 meq/L (ref 19–32)
Calcium: 9.8 mg/dL (ref 8.4–10.5)
Chloride: 106 meq/L (ref 96–112)
Creatinine, Ser: 0.96 mg/dL (ref 0.40–1.20)
GFR: 63.51 mL/min
Glucose, Bld: 92 mg/dL (ref 70–99)
Potassium: 4.5 meq/L (ref 3.5–5.1)
Sodium: 144 meq/L (ref 135–145)
Total Bilirubin: 0.9 mg/dL (ref 0.2–1.2)
Total Protein: 6.8 g/dL (ref 6.0–8.3)

## 2024-05-23 LAB — VITAMIN D 25 HYDROXY (VIT D DEFICIENCY, FRACTURES): VITD: 19.78 ng/mL — ABNORMAL LOW (ref 30.00–100.00)

## 2024-05-23 LAB — TSH: TSH: 0.75 u[IU]/mL (ref 0.35–5.50)

## 2024-05-23 LAB — HEMOGLOBIN A1C: Hgb A1c MFr Bld: 5.6 % (ref 4.6–6.5)

## 2024-05-23 NOTE — Assessment & Plan Note (Signed)
 History of same.  Patient currently maintained on lifestyle modifications.  Blood pressure controlled

## 2024-05-23 NOTE — Progress Notes (Signed)
 "  Established Patient Office Visit  Subjective   Patient ID: Diane Jenkins, female    DOB: 11-24-1960  Age: 64 y.o. MRN: [id number]  Chief Complaint  Patient presents with   Annual Exam    HPI  HTN: Currently maintained on lifestyle modifications  MDD: Not currently on any medical treatment.  Patient has been on Zoloft  and Wellbutrin  in the past.  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  Patient also mentioned that last physical her husband was diagnosed with prostate cancer  Postherpetic neuralgia: was prescribed gabapentin  but no longer taking. She will take it intermittently to help with sleep   for complete physical and follow up of chronic conditions.  Immunizations: -Tetanus: Completed in 2024 -Influenza:declines -Shingles: not completed had shingles in 01/2024 -Pneumonia: declines  Diet: Fair diet. She is eating 2 meals a day. She is also snakcin. She will do biled eggs and toast in the morning. She will have hot tea with honey. She has stopped coffee. State that she normally does not have lunch. Dinner is whatever she can get  Exercise: She is doing pilates with her daughter and she is doing it daily at home. She is wotking on a half marathon and walking on the treadmill with speeds of at least 3.  Eye exam: Completes annually. Galsses   Dental exam: Completes semi-annually    Colonoscopy: Completed in 09/16/19, repeat 10 years.  Patient due 2031 Lung Cancer Screening: former cigar smoker  Pap smear: 01/22/2023 negative cells and HPV.  Patient will be due in 3 years 2029  Mammogram: 12/26/2022, overdue.  Last one done at the [facility]  DEXA: Too young  Sleep:  going to bed aroun 1030 and get up around 5. States that she feels rested most time. She will get her walk in the morning time       Review of Systems  Constitutional:  Negative for chills and fever.  Respiratory:  Negative for  shortness of breath.   Cardiovascular:  Negative for chest pain and leg swelling.  Gastrointestinal:  Negative for abdominal pain, blood in stool, constipation, diarrhea, nausea and vomiting.       BM daily with probiotic  Genitourinary:  Negative for dysuria and hematuria.  Neurological:  Negative for dizziness, tingling and headaches.  Psychiatric/Behavioral:  Negative for hallucinations and suicidal ideas.       Objective:     BP 110/80   Pulse 85   Temp 97.7 F (36.5 C) (Oral)   Ht 5' (1.524 m)   Wt 155 lb 3.2 oz (70.4 kg)   SpO2 98%   BMI 30.31 kg/m  BP Readings from Last 3 Encounters:  05/23/24 110/80  01/11/24 122/82  01/22/23 120/88   Wt Readings from Last 3 Encounters:  05/23/24 155 lb 3.2 oz (70.4 kg)  01/11/24 146 lb 3.2 oz (66.3 kg)  01/22/23 154 lb 9.6 oz (70.1 kg)   SpO2 Readings from Last 3 Encounters:  05/23/24 98%  01/11/24 99%  01/22/23 96%      Physical Exam Vitals and nursing note reviewed.  Constitutional:      Appearance: Normal appearance.  HENT:     Right Ear: Tympanic membrane, ear canal and external ear normal.     Left Ear: Tympanic membrane, ear canal and external ear normal.     Mouth/Throat:     Mouth: Mucous membranes are moist.     Pharynx: Oropharynx is clear.  Eyes:     Extraocular Movements: Extraocular movements intact.     Pupils: Pupils are equal, round, and reactive to light.  Cardiovascular:     Rate and Rhythm: Normal rate and regular rhythm.     Pulses: Normal pulses.     Heart sounds: Normal heart sounds.  Pulmonary:     Effort: Pulmonary effort is normal.     Breath sounds: Normal breath sounds.  Abdominal:     General: Bowel sounds are normal. There is no distension.     Palpations: There is no mass.     Tenderness: There is no abdominal tenderness.     Hernia: No hernia is present.  Musculoskeletal:     Right lower leg: No edema.     Left lower leg: No edema.  Lymphadenopathy:     Cervical: No cervical  adenopathy.  Skin:    General: Skin is warm.  Neurological:     General: No focal deficit present.     Mental Status: She is alert.     Deep Tendon Reflexes:     Reflex Scores:      Bicep reflexes are 2+ on the right side and 2+ on the left side.      Patellar reflexes are 2+ on the right side and 2+ on the left side.    Comments: Bilateral upper and lower extremity strength 5/5  Psychiatric:        Mood and Affect: Mood normal.        Behavior: Behavior normal.        Thought Content: Thought content normal.        Judgment: Judgment normal.      No results found for any visits on 05/23/24.    The 10-year ASCVD risk score (Arnett DK, et al., 2019) is: 4.1%    Assessment & Plan:   Problem List Items Addressed This Visit       Cardiovascular and Mediastinum   Primary hypertension   History of same.  Patient currently maintained on lifestyle modifications.  Blood pressure controlled      Relevant Orders   CBC with Differential/Platelet   Comprehensive metabolic panel with GFR   Hemoglobin A1c   TSH   Lipid panel     Other   Vitamin D  deficiency   History of the same pending vitamin D  level today      Relevant Orders   VITAMIN D  25 Hydroxy (Vit-D Deficiency, Fractures)   Pre-diabetes   History of the same.  Patient is exercising adequate amounts and working on her diet.  We did discuss GLP-1 receptor agonist.  Patient does not have a personal or family history of medullary thyroid  cancer or multiple endocrine neoplasia syndrome type II.  Pending A1c today      Relevant Orders   Hemoglobin A1c   Depression, major, single episode, severe (HCC)   History of the same.  Patient has been on Zoloft  and Wellbutrin  in the past.  Patient declines any medication or therapy at this juncture.  No active SI/HI/AVH.      Relevant Orders   TSH   VITAMIN D  25 Hydroxy (Vit-D Deficiency, Fractures)   Preventative health care - Primary   Discussed age-appropriate  immunizations and screening exams.  Did review patient's personal, surgical, social, family histories.  Patient is up-to-date with all age-appropriate vaccinations he would like.  Patient declined flu and pneumonia vaccine.  Patient does not qualify for shingles vaccine as she was recently diagnosed with  shingles.  Patient up-to-date on CRC screening and cervical cancer screening.  Mammogram placed for breast cancer screening.  Patient was given information at discharge about preventative healthcare maintenance with anticipatory guidance.      Relevant Orders   CBC with Differential/Platelet   Comprehensive metabolic panel with GFR   TSH   Elevated LDL cholesterol level   Pending lipid panel continue working on healthy lifestyle modifications      Relevant Orders   Lipid panel   Other Visit Diagnoses       Screening mammogram for breast cancer       Relevant Orders   MM 3D SCREENING MAMMOGRAM BILATERAL BREAST       Return in about 1 year (around 05/23/2025) for CPE and Labs.    Adina Crandall, NP  "

## 2024-05-23 NOTE — Assessment & Plan Note (Signed)
 Pending lipid panel continue working on healthy lifestyle modifications.

## 2024-05-23 NOTE — Assessment & Plan Note (Signed)
History of the same pending vitamin D level today

## 2024-05-23 NOTE — Assessment & Plan Note (Signed)
 History of the same.  Patient has been on Zoloft  and Wellbutrin  in the past.  Patient declines any medication or therapy at this juncture.  No active SI/HI/AVH.

## 2024-05-23 NOTE — Patient Instructions (Signed)
 Nice to see you today  I will be in touch with the labs once I have them  Zepbound and Wegovy are the two injectable medications approved for weight loss. You can message me on mychart if your insurance covers them   Follow up in 1 year for your next physical 3 months if you start one of the injectable

## 2024-05-23 NOTE — Assessment & Plan Note (Signed)
 History of the same.  Patient is exercising adequate amounts and working on her diet.  We did discuss GLP-1 receptor agonist.  Patient does not have a personal or family history of medullary thyroid  cancer or multiple endocrine neoplasia syndrome type II.  Pending A1c today

## 2024-05-23 NOTE — Assessment & Plan Note (Signed)
 Discussed age-appropriate immunizations and screening exams.  Did review patient's personal, surgical, social, family histories.  Patient is up-to-date with all age-appropriate vaccinations he would like.  Patient declined flu and pneumonia vaccine.  Patient does not qualify for shingles vaccine as she was recently diagnosed with shingles.  Patient up-to-date on CRC screening and cervical cancer screening.  Mammogram placed for breast cancer screening.  Patient was given information at discharge about preventative healthcare maintenance with anticipatory guidance.

## 2024-06-03 ENCOUNTER — Ambulatory Visit

## 2025-05-25 ENCOUNTER — Encounter: Admitting: Nurse Practitioner
